# Patient Record
Sex: Female | Born: 1985
Health system: Southern US, Community
[De-identification: ages and names within clinical notes are randomized; demographics above are authoritative.]

## PROBLEM LIST (undated history)

## (undated) DIAGNOSIS — R87613 High grade squamous intraepithelial lesion on cytologic smear of cervix (HGSIL): Secondary | ICD-10-CM

## (undated) DIAGNOSIS — M797 Fibromyalgia: Secondary | ICD-10-CM

## (undated) DIAGNOSIS — E282 Polycystic ovarian syndrome: Secondary | ICD-10-CM

## (undated) DIAGNOSIS — R87612 Low grade squamous intraepithelial lesion on cytologic smear of cervix (LGSIL): Secondary | ICD-10-CM

## (undated) DIAGNOSIS — D179 Benign lipomatous neoplasm, unspecified: Secondary | ICD-10-CM

## (undated) DIAGNOSIS — R87619 Unspecified abnormal cytological findings in specimens from cervix uteri: Secondary | ICD-10-CM

## (undated) DIAGNOSIS — M069 Rheumatoid arthritis, unspecified: Secondary | ICD-10-CM

## (undated) DIAGNOSIS — A5909 Other urogenital trichomoniasis: Secondary | ICD-10-CM

## (undated) DIAGNOSIS — F341 Dysthymic disorder: Secondary | ICD-10-CM

## (undated) HISTORY — PX: LIPOMA EXCISION: SHX5283

## (undated) HISTORY — DX: Fibromyalgia: M79.7

## (undated) HISTORY — DX: Dysthymic disorder: F34.1

## (undated) HISTORY — PX: CHOLECYSTECTOMY: SHX55

## (undated) HISTORY — DX: Low grade squamous intraepithelial lesion on cytologic smear of cervix (LGSIL): R87.612

## (undated) HISTORY — DX: Benign lipomatous neoplasm, unspecified: D17.9

## (undated) HISTORY — PX: COLPOSCOPY: SHX161

## (undated) HISTORY — DX: High grade squamous intraepithelial lesion on cytologic smear of cervix (HGSIL): R87.613

## (undated) HISTORY — DX: Polycystic ovarian syndrome: E28.2

## (undated) HISTORY — DX: Unspecified abnormal cytological findings in specimens from cervix uteri: R87.619

## (undated) HISTORY — DX: Rheumatoid arthritis, unspecified: M06.9

## (undated) HISTORY — DX: Other urogenital trichomoniasis: A59.09

## (undated) HISTORY — PX: PARTIAL GASTRECTOMY: SHX2172

---
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
## 2006-12-06 DIAGNOSIS — R87619 Unspecified abnormal cytological findings in specimens from cervix uteri: Secondary | ICD-10-CM

## 2006-12-06 HISTORY — DX: Unspecified abnormal cytological findings in specimens from cervix uteri: R87.619

## 2015-11-13 ENCOUNTER — Encounter: Payer: Self-pay | Admitting: Family Medicine

## 2015-11-13 ENCOUNTER — Ambulatory Visit (INDEPENDENT_AMBULATORY_CARE_PROVIDER_SITE_OTHER): Payer: PRIVATE HEALTH INSURANCE | Admitting: Family Medicine

## 2015-11-13 VITALS — BP 109/71 | HR 72 | Resp 16 | Ht 68.0 in | Wt 174.0 lb

## 2015-11-13 DIAGNOSIS — Z124 Encounter for screening for malignant neoplasm of cervix: Secondary | ICD-10-CM | POA: Diagnosis not present

## 2015-11-13 DIAGNOSIS — Z01419 Encounter for gynecological examination (general) (routine) without abnormal findings: Secondary | ICD-10-CM | POA: Diagnosis not present

## 2015-11-13 DIAGNOSIS — Z113 Encounter for screening for infections with a predominantly sexual mode of transmission: Secondary | ICD-10-CM | POA: Diagnosis not present

## 2015-11-13 DIAGNOSIS — Z30431 Encounter for routine checking of intrauterine contraceptive device: Secondary | ICD-10-CM | POA: Diagnosis not present

## 2015-11-13 NOTE — Progress Notes (Signed)
  Subjective:     Kaylee Anderson is a 29 y.o. female and is here for a comprehensive physical exam. The patient reports problems - sexually active and some discharge noted. Has Mirena for [facility]. Still having heavy cycles.  Has h/o PCOS, which improved after weight loss following gastric bypass.  Social History   Social History  . Marital Status: Single    Spouse Name: N/A  . Number of Children: N/A  . Years of Education: N/A   Occupational History  . student    Social History Main Topics  . Smoking status: Never Smoker   . Smokeless tobacco: Never Used  . Alcohol Use: 0.0 oz/week    0 Standard drinks or equivalent per week     Comment: occassional  . Drug Use: No  . Sexual Activity:    Partners: Male     Comment: condoms and Mirena   Other Topics Concern  . Not on file   Social History Narrative  . No narrative on file   There are no preventive care reminders to display for this patient.  The following portions of the patient's history were reviewed and updated as appropriate: allergies, current medications, past family history, past medical history, past social history, past surgical history and problem list.  Review of Systems Pertinent items are noted in HPI.   Objective:    BP 109/71 mmHg  Pulse 72  Resp 16  Ht 5\' 8"  (1.727 m)  Wt 174 lb (78.926 kg)  BMI 26.46 kg/m2  LMP 10/22/2015 General appearance: alert, cooperative and appears stated age Head: Normocephalic, without obvious abnormality, atraumatic Neck: no adenopathy, supple, symmetrical, trachea midline and thyroid not enlarged, symmetric, no tenderness/mass/nodules Lungs: clear to auscultation bilaterally Breasts: normal appearance, no masses or tenderness Heart: regular rate and rhythm, S1, S2 normal, no murmur, click, rub or gallop Abdomen: soft, non-tender; bowel sounds normal; no masses,  no organomegaly Pelvic: cervix normal in appearance, external genitalia normal, no adnexal masses or tenderness, no  cervical motion tenderness, uterus normal size, shape, and consistency and green discharge noted. Extremities: extremities normal, atraumatic, no cyanosis or edema Pulses: 2+ and symmetric Skin: Skin color, texture, turgor normal. No rashes or lesions Lymph nodes: Cervical, supraclavicular, and axillary nodes normal. Neurologic: Grossly normal    Assessment:    Healthy female exam.      Plan:   Problem List Items Addressed This Visit    None    Visit Diagnoses    Encounter for routine gynecological examination    -  Primary    Relevant Orders    TSH (Completed)    CBC (Completed)    Lipid panel (Completed)    Comprehensive metabolic panel (Completed)    WET PREP BY MOLECULAR PROBE (Completed)    Genetic Screening    Screen for STD (sexually transmitted disease)        Relevant Orders    HIV antibody (Completed)    Hepatitis B surface antigen (Completed)    Hepatitis C antibody (Completed)    RPR (Completed)    Cytology - PAP    Screening for malignant neoplasm of cervix        Relevant Orders    Cytology - PAP         See After Visit Summary for Counseling Recommendations

## 2015-11-13 NOTE — Patient Instructions (Signed)
BRCA-1 and BRCA-2 Testing BRCA-1 and BRCA-2 are genes that make proteins that help repair damaged cells. BRCA-1 and BRCA-2 testing is done to see if there is a mutation in either of these genes. If there is a mutation, the genes may not be able to help repair damaged cells. As a result, the cells may develop defects that can lead to certain types of cancer. You may have this test if you have a family history of certain types of cancer, including cancer of the:  Breast.  Fallopian tubes.  Ovaries.  Peritoneum. The test requires either a sample of blood or a sample of the cells from the inside of your cheek. If a sample of blood is taken, it will be drawn from a vein in your arm using a thin needle. If a sample of cells is taken, you will get instructions on how to use a rinse to collect the sample. RESULTS It is your responsibility to obtain your test results. Ask the lab or the department doing the test when and how you will get the results. Contact your health care provider if you have any questions about your results. The lab test results can show whether:  You do not have a mutation in the BRCA-1 or BRCA-2 gene that increases your risk for certain cancers.  You have a mutation in the BRCA-1 or BRCA-2 gene that increases your risk for certain cancers.  You have a mutation in the BRCA-1 or BRCA-2 gene that has not been found to increase your risk for certain cancers. Meaning of Negative Test Results A negative test result means that you do not have a mutation in the BRCA-1 or BRCA-2 gene that is known to increase your risk for certain cancers. This does not mean you will never get cancer. Talk to your health care provider or genetic counselor about what this result means for you. Meaning of Positive Test Results A positive test result means that you do have a mutation in the BRCA-1 or BRCA-2 gene that increases your risk for certain cancers. Women with a positive test result have an  increased risk for ovarian cancer. Both women and men with a mutation have an increased risk for breast cancer and may be at greater risk for other types of cancer. Getting a positive test result does not mean you will develop cancer.  You may be told you are a carrier. This means you can pass the mutation to your children.  Talk to your health care provider or genetic counselor about what this result means for you. Meaning of Ambiguous Test Results Ambiguous, inconclusive, or uncertain test results mean there is a change in the BRCA-1 or BRCA-2 gene, but this change has not been linked to cancer. Talk to your health care provider or genetic counselor about what this result means for you.   This information is not intended to replace advice given to you by your health care provider. Make sure you discuss any questions you have with your health care provider.   Document Released: 12/16/2004 Document Revised: 12/13/2014 Document Reviewed: 02/21/2014 Elsevier Interactive Patient Education 2016 [place] for Adults, Female A healthy lifestyle and preventive care can promote health and wellness. Preventive health guidelines for women include the following key practices.  A routine yearly physical is a good way to check with your health care provider about your health and preventive screening. It is a chance to share any concerns and updates on your health and to receive  a thorough exam.  Visit your dentist for a routine exam and preventive care every 6 months. Brush your teeth twice a day and floss once a day. Good oral hygiene prevents tooth decay and gum disease.  The frequency of eye exams is based on your age, health, family medical history, use of contact lenses, and other factors. Follow your health care provider's recommendations for frequency of eye exams.  Eat a healthy diet. Foods like vegetables, fruits, whole grains, low-fat dairy products, and lean protein foods  contain the nutrients you need without too many calories. Decrease your intake of foods high in solid fats, added sugars, and salt. Eat the right amount of calories for you.Get information about a proper diet from your health care provider, if necessary.  Regular physical exercise is one of the most important things you can do for your health. Most adults should get at least 150 minutes of moderate-intensity exercise (any activity that increases your heart rate and causes you to sweat) each week. In addition, most adults need muscle-strengthening exercises on 2 or more days a week.  Maintain a healthy weight. The body mass index (BMI) is a screening tool to identify possible weight problems. It provides an estimate of body fat based on height and weight. Your health care provider can find your BMI and can help you achieve or maintain a healthy weight.For adults 20 years and older:  A BMI below 18.5 is considered underweight.  A BMI of 18.5 to 24.9 is normal.  A BMI of 25 to 29.9 is considered overweight.  A BMI of 30 and above is considered obese.  Maintain normal blood lipids and cholesterol levels by exercising and minimizing your intake of saturated fat. Eat a balanced diet with plenty of fruit and vegetables. Blood tests for lipids and cholesterol should begin at age 68 and be repeated every 5 years. If your lipid or cholesterol levels are high, you are over 50, or you are at high risk for heart disease, you may need your cholesterol levels checked more frequently.Ongoing high lipid and cholesterol levels should be treated with medicines if diet and exercise are not working.  If you smoke, find out from your health care provider how to quit. If you do not use tobacco, do not start.  Lung cancer screening is recommended for adults aged 75-80 years who are at high risk for developing lung cancer because of a history of smoking. A yearly low-dose CT scan of the lungs is recommended for people  who have at least a 30-pack-year history of smoking and are a current smoker or have quit within the past 15 years. A pack year of smoking is smoking an average of 1 pack of cigarettes a day for 1 year (for example: 1 pack a day for 30 years or 2 packs a day for 15 years). Yearly screening should continue until the smoker has stopped smoking for at least 15 years. Yearly screening should be stopped for people who develop a health problem that would prevent them from having lung cancer treatment.  If you are pregnant, do not drink alcohol. If you are breastfeeding, be very cautious about drinking alcohol. If you are not pregnant and choose to drink alcohol, do not have more than 1 drink per day. One drink is considered to be 12 ounces (355 mL) of beer, 5 ounces (148 mL) of wine, or 1.5 ounces (44 mL) of liquor.  Avoid use of street drugs. Do not share needles with anyone.  Ask for help if you need support or instructions about stopping the use of drugs.  High blood pressure causes heart disease and increases the risk of stroke. Your blood pressure should be checked at least every 1 to 2 years. Ongoing high blood pressure should be treated with medicines if weight loss and exercise do not work.  If you are 46-80 years old, ask your health care provider if you should take aspirin to prevent strokes.  Diabetes screening is done by taking a blood sample to check your blood glucose level after you have not eaten for a certain period of time (fasting). If you are not overweight and you do not have risk factors for diabetes, you should be screened once every 3 years starting at age 20. If you are overweight or obese and you are 73-45 years of age, you should be screened for diabetes every year as part of your cardiovascular risk assessment.  Breast cancer screening is essential preventive care for women. You should practice "breast self-awareness." This means understanding the normal appearance and feel of your  breasts and may include breast self-examination. Any changes detected, no matter how small, should be reported to a health care provider. Women in their 46s and 30s should have a clinical breast exam (CBE) by a health care provider as part of a regular health exam every 1 to 3 years. After age 64, women should have a CBE every year. Starting at age 37, women should consider having a mammogram (breast X-ray test) every year. Women who have a family history of breast cancer should talk to their health care provider about genetic screening. Women at a high risk of breast cancer should talk to their health care providers about having an MRI and a mammogram every year.  Breast cancer gene (BRCA)-related cancer risk assessment is recommended for women who have family members with BRCA-related cancers. BRCA-related cancers include breast, ovarian, tubal, and peritoneal cancers. Having family members with these cancers may be associated with an increased risk for harmful changes (mutations) in the breast cancer genes BRCA1 and BRCA2. Results of the assessment will determine the need for genetic counseling and BRCA1 and BRCA2 testing.  Your health care provider may recommend that you be screened regularly for cancer of the pelvic organs (ovaries, uterus, and vagina). This screening involves a pelvic examination, including checking for microscopic changes to the surface of your cervix (Pap test). You may be encouraged to have this screening done every 3 years, beginning at age 60.  For women ages 20-65, health care providers may recommend pelvic exams and Pap testing every 3 years, or they may recommend the Pap and pelvic exam, combined with testing for human papilloma virus (HPV), every 5 years. Some types of HPV increase your risk of cervical cancer. Testing for HPV may also be done on women of any age with unclear Pap test results.  Other health care providers may not recommend any screening for nonpregnant women  who are considered low risk for pelvic cancer and who do not have symptoms. Ask your health care provider if a screening pelvic exam is right for you.  If you have had past treatment for cervical cancer or a condition that could lead to cancer, you need Pap tests and screening for cancer for at least 20 years after your treatment. If Pap tests have been discontinued, your risk factors (such as having a new sexual partner) need to be reassessed to determine if screening should resume. Some women  have medical problems that increase the chance of getting cervical cancer. In these cases, your health care provider may recommend more frequent screening and Pap tests.  Colorectal cancer can be detected and often prevented. Most routine colorectal cancer screening begins at the age of 88 years and continues through age 17 years. However, your health care provider may recommend screening at an earlier age if you have risk factors for colon cancer. On a yearly basis, your health care provider may provide home test kits to check for hidden blood in the stool. Use of a small camera at the end of a tube, to directly examine the colon (sigmoidoscopy or colonoscopy), can detect the earliest forms of colorectal cancer. Talk to your health care provider about this at age 90, when routine screening begins. Direct exam of the colon should be repeated every 5-10 years through age 84 years, unless early forms of precancerous polyps or small growths are found.  People who are at an increased risk for hepatitis B should be screened for this virus. You are considered at high risk for hepatitis B if:  You were born in a country where hepatitis B occurs often. Talk with your health care provider about which countries are considered high risk.  Your parents were born in a high-risk country and you have not received a shot to protect against hepatitis B (hepatitis B vaccine).  You have HIV or AIDS.  You use needles to inject  street drugs.  You live with, or have sex with, someone who has hepatitis B.  You get hemodialysis treatment.  You take certain medicines for conditions like cancer, organ transplantation, and autoimmune conditions.  Hepatitis C blood testing is recommended for all people born from [birth year] through 1964 and any individual with known risks for hepatitis C.  Practice safe sex. Use condoms and avoid high-risk sexual practices to reduce the spread of sexually transmitted infections (STIs). STIs include gonorrhea, chlamydia, syphilis, trichomonas, herpes, HPV, and human immunodeficiency virus (HIV). Herpes, HIV, and HPV are viral illnesses that have no cure. They can result in disability, cancer, and death.  You should be screened for sexually transmitted illnesses (STIs) including gonorrhea and chlamydia if:  You are sexually active and are younger than 24 years.  You are older than 24 years and your health care provider tells you that you are at risk for this type of infection.  Your sexual activity has changed since you were last screened and you are at an increased risk for chlamydia or gonorrhea. Ask your health care provider if you are at risk.  If you are at risk of being infected with HIV, it is recommended that you take a prescription medicine daily to prevent HIV infection. This is called preexposure prophylaxis (PrEP). You are considered at risk if:  You are sexually active and do not regularly use condoms or know the HIV status of your partner(s).  You take drugs by injection.  You are sexually active with a partner who has HIV.  Talk with your health care provider about whether you are at high risk of being infected with HIV. If you choose to begin PrEP, you should first be tested for HIV. You should then be tested every 3 months for as long as you are taking PrEP.  Osteoporosis is a disease in which the bones lose minerals and strength with aging. This can result in serious bone  fractures or breaks. The risk of osteoporosis can be identified using a bone density  scan. Women ages 59 years and over and women at risk for fractures or osteoporosis should discuss screening with their health care providers. Ask your health care provider whether you should take a calcium supplement or vitamin D to reduce the rate of osteoporosis.  Menopause can be associated with physical symptoms and risks. Hormone replacement therapy is available to decrease symptoms and risks. You should talk to your health care provider about whether hormone replacement therapy is right for you.  Use sunscreen. Apply sunscreen liberally and repeatedly throughout the day. You should seek shade when your shadow is shorter than you. Protect yourself by wearing long sleeves, pants, a wide-brimmed hat, and sunglasses year round, whenever you are outdoors.  Once a month, do a whole body skin exam, using a mirror to look at the skin on your back. Tell your health care provider of new moles, moles that have irregular borders, moles that are larger than a pencil eraser, or moles that have changed in shape or color.  Stay current with required vaccines (immunizations).  Influenza vaccine. All adults should be immunized every year.  Tetanus, diphtheria, and acellular pertussis (Td, Tdap) vaccine. Pregnant women should receive 1 dose of Tdap vaccine during each pregnancy. The dose should be obtained regardless of the length of time since the last dose. Immunization is preferred during the 27th-36th week of gestation. An adult who has not previously received Tdap or who does not know her vaccine status should receive 1 dose of Tdap. This initial dose should be followed by tetanus and diphtheria toxoids (Td) booster doses every 10 years. Adults with an unknown or incomplete history of completing a 3-dose immunization series with Td-containing vaccines should begin or complete a primary immunization series including a Tdap dose.  Adults should receive a Td booster every 10 years.  Varicella vaccine. An adult without evidence of immunity to varicella should receive 2 doses or a second dose if she has previously received 1 dose. Pregnant females who do not have evidence of immunity should receive the first dose after pregnancy. This first dose should be obtained before leaving the health care facility. The second dose should be obtained 4-8 weeks after the first dose.  Human papillomavirus (HPV) vaccine. Females aged 13-26 years who have not received the vaccine previously should obtain the 3-dose series. The vaccine is not recommended for use in pregnant females. However, pregnancy testing is not needed before receiving a dose. If a female is found to be pregnant after receiving a dose, no treatment is needed. In that case, the remaining doses should be delayed until after the pregnancy. Immunization is recommended for any person with an immunocompromised condition through the age of 37 years if she did not get any or all doses earlier. During the 3-dose series, the second dose should be obtained 4-8 weeks after the first dose. The third dose should be obtained 24 weeks after the first dose and 16 weeks after the second dose.  Zoster vaccine. One dose is recommended for adults aged 53 years or older unless certain conditions are present.  Measles, mumps, and rubella (MMR) vaccine. Adults born before [birth year] generally are considered immune to measles and mumps. Adults born in [birth year] or later should have 1 or more doses of MMR vaccine unless there is a contraindication to the vaccine or there is laboratory evidence of immunity to each of the three diseases. A routine second dose of MMR vaccine should be obtained at least 28 days after the first dose  for students attending postsecondary schools, health care workers, or international travelers. People who received inactivated measles vaccine or an unknown type of measles vaccine during  1963-1967 should receive 2 doses of MMR vaccine. People who received inactivated mumps vaccine or an unknown type of mumps vaccine before 1979 and are at high risk for mumps infection should consider immunization with 2 doses of MMR vaccine. For females of childbearing age, rubella immunity should be determined. If there is no evidence of immunity, females who are not pregnant should be vaccinated. If there is no evidence of immunity, females who are pregnant should delay immunization until after pregnancy. Unvaccinated health care workers born before [birth year] who lack laboratory evidence of measles, mumps, or rubella immunity or laboratory confirmation of disease should consider measles and mumps immunization with 2 doses of MMR vaccine or rubella immunization with 1 dose of MMR vaccine.  Pneumococcal 13-valent conjugate (PCV13) vaccine. When indicated, a person who is uncertain of his immunization history and has no record of immunization should receive the PCV13 vaccine. All adults 37 years of age and older should receive this vaccine. An adult aged 83 years or older who has certain medical conditions and has not been previously immunized should receive 1 dose of PCV13 vaccine. This PCV13 should be followed with a dose of pneumococcal polysaccharide (PPSV23) vaccine. Adults who are at high risk for pneumococcal disease should obtain the PPSV23 vaccine at least 8 weeks after the dose of PCV13 vaccine. Adults older than 28 years of age who have normal immune system function should obtain the PPSV23 vaccine dose at least 1 year after the dose of PCV13 vaccine.  Pneumococcal polysaccharide (PPSV23) vaccine. When PCV13 is also indicated, PCV13 should be obtained first. All adults aged 44 years and older should be immunized. An adult younger than age 56 years who has certain medical conditions should be immunized. Any person who resides in a nursing home or long-term care facility should be immunized. An adult smoker  should be immunized. People with an immunocompromised condition and certain other conditions should receive both PCV13 and PPSV23 vaccines. People with human immunodeficiency virus (HIV) infection should be immunized as soon as possible after diagnosis. Immunization during chemotherapy or radiation therapy should be avoided. Routine use of PPSV23 vaccine is not recommended for American Indians, [place] Natives, or people younger than 65 years unless there are medical conditions that require PPSV23 vaccine. When indicated, people who have unknown immunization and have no record of immunization should receive PPSV23 vaccine. One-time revaccination 5 years after the first dose of PPSV23 is recommended for people aged 19-64 years who have chronic kidney failure, nephrotic syndrome, asplenia, or immunocompromised conditions. People who received 1-2 doses of PPSV23 before age 72 years should receive another dose of PPSV23 vaccine at age 38 years or later if at least 5 years have passed since the previous dose. Doses of PPSV23 are not needed for people immunized with PPSV23 at or after age 38 years.  Meningococcal vaccine. Adults with asplenia or persistent complement component deficiencies should receive 2 doses of quadrivalent meningococcal conjugate (MenACWY-D) vaccine. The doses should be obtained at least 2 months apart. Microbiologists working with certain meningococcal bacteria, [employer] recruits, people at risk during an outbreak, and people who travel to or live in countries with a high rate of meningitis should be immunized. A first-year college student up through age 31 years who is living in a residence hall should receive a dose if she did not receive a dose  on or after her 16th birthday. Adults who have certain high-risk conditions should receive one or more doses of vaccine.  Hepatitis A vaccine. Adults who wish to be protected from this disease, have certain high-risk conditions, work with hepatitis  A-infected animals, work in hepatitis A research labs, or travel to or work in countries with a high rate of hepatitis A should be immunized. Adults who were previously unvaccinated and who anticipate close contact with an international adoptee during the first 60 days after arrival in the [place] from a country with a high rate of hepatitis A should be immunized.  Hepatitis B vaccine. Adults who wish to be protected from this disease, have certain high-risk conditions, may be exposed to blood or other infectious body fluids, are household contacts or sex partners of hepatitis B positive people, are clients or workers in certain care facilities, or travel to or work in countries with a high rate of hepatitis B should be immunized.  Haemophilus influenzae type b (Hib) vaccine. A previously unvaccinated person with asplenia or sickle cell disease or having a scheduled splenectomy should receive 1 dose of Hib vaccine. Regardless of previous immunization, a recipient of a hematopoietic stem cell transplant should receive a 3-dose series 6-12 months after her successful transplant. Hib vaccine is not recommended for adults with HIV infection. Preventive Services / Frequency Ages 72 to 60 years  Blood pressure check.** / Every 3-5 years.  Lipid and cholesterol check.** / Every 5 years beginning at age 5.  Clinical breast exam.** / Every 3 years for women in their 24s and 74s.  BRCA-related cancer risk assessment.** / For women who have family members with a BRCA-related cancer (breast, ovarian, tubal, or peritoneal cancers).  Pap test.** / Every 2 years from ages 96 through 35. Every 3 years starting at age 71 through age 28 or 6 with a history of 3 consecutive normal Pap tests.  HPV screening.** / Every 3 years from ages 70 through ages 51 to 51 with a history of 3 consecutive normal Pap tests.  Hepatitis C blood test.** / For any individual with known risks for hepatitis C.  Skin self-exam.  / Monthly.  Influenza vaccine. / Every year.  Tetanus, diphtheria, and acellular pertussis (Tdap, Td) vaccine.** / Consult your health care provider. Pregnant women should receive 1 dose of Tdap vaccine during each pregnancy. 1 dose of Td every 10 years.  Varicella vaccine.** / Consult your health care provider. Pregnant females who do not have evidence of immunity should receive the first dose after pregnancy.  HPV vaccine. / 3 doses over 6 months, if 39 and younger. The vaccine is not recommended for use in pregnant females. However, pregnancy testing is not needed before receiving a dose.  Measles, mumps, rubella (MMR) vaccine.** / You need at least 1 dose of MMR if you were born in 1957 or later. You may also need a 2nd dose. For females of childbearing age, rubella immunity should be determined. If there is no evidence of immunity, females who are not pregnant should be vaccinated. If there is no evidence of immunity, females who are pregnant should delay immunization until after pregnancy.  Pneumococcal 13-valent conjugate (PCV13) vaccine.** / Consult your health care provider.  Pneumococcal polysaccharide (PPSV23) vaccine.** / 1 to 2 doses if you smoke cigarettes or if you have certain conditions.  Meningococcal vaccine.** / 1 dose if you are age 3 to 54 years and a Market researcher living in a residence hall,  or have one of several medical conditions, you need to get vaccinated against meningococcal disease. You may also need additional booster doses.  Hepatitis A vaccine.** / Consult your health care provider.  Hepatitis B vaccine.** / Consult your health care provider.  Haemophilus influenzae type b (Hib) vaccine.** / Consult your health care provider. Ages 36 to 84 years  Blood pressure check.** / Every year.  Lipid and cholesterol check.** / Every 5 years beginning at age 29 years.  Lung cancer screening. / Every year if you are aged 52-80 years and have a  30-pack-year history of smoking and currently smoke or have quit within the past 15 years. Yearly screening is stopped once you have quit smoking for at least 15 years or develop a health problem that would prevent you from having lung cancer treatment.  Clinical breast exam.** / Every year after age 52 years.  BRCA-related cancer risk assessment.** / For women who have family members with a BRCA-related cancer (breast, ovarian, tubal, or peritoneal cancers).  Mammogram.** / Every year beginning at age 39 years and continuing for as long as you are in good health. Consult with your health care provider.  Pap test.** / Every 3 years starting at age 88 years through age 31 or 75 years with a history of 3 consecutive normal Pap tests.  HPV screening.** / Every 3 years from ages 9 years through ages 24 to 48 years with a history of 3 consecutive normal Pap tests.  Fecal occult blood test (FOBT) of stool. / Every year beginning at age 41 years and continuing until age 21 years. You may not need to do this test if you get a colonoscopy every 10 years.  Flexible sigmoidoscopy or colonoscopy.** / Every 5 years for a flexible sigmoidoscopy or every 10 years for a colonoscopy beginning at age 68 years and continuing until age 12 years.  Hepatitis C blood test.** / For all people born from [birth year] through 1965 and any individual with known risks for hepatitis C.  Skin self-exam. / Monthly.  Influenza vaccine. / Every year.  Tetanus, diphtheria, and acellular pertussis (Tdap/Td) vaccine.** / Consult your health care provider. Pregnant women should receive 1 dose of Tdap vaccine during each pregnancy. 1 dose of Td every 10 years.  Varicella vaccine.** / Consult your health care provider. Pregnant females who do not have evidence of immunity should receive the first dose after pregnancy.  Zoster vaccine.** / 1 dose for adults aged 66 years or older.  Measles, mumps, rubella (MMR) vaccine.** / You need  at least 1 dose of MMR if you were born in 1957 or later. You may also need a second dose. For females of childbearing age, rubella immunity should be determined. If there is no evidence of immunity, females who are not pregnant should be vaccinated. If there is no evidence of immunity, females who are pregnant should delay immunization until after pregnancy.  Pneumococcal 13-valent conjugate (PCV13) vaccine.** / Consult your health care provider.  Pneumococcal polysaccharide (PPSV23) vaccine.** / 1 to 2 doses if you smoke cigarettes or if you have certain conditions.  Meningococcal vaccine.** / Consult your health care provider.  Hepatitis A vaccine.** / Consult your health care provider.  Hepatitis B vaccine.** / Consult your health care provider.  Haemophilus influenzae type b (Hib) vaccine.** / Consult your health care provider. Ages 52 years and over  Blood pressure check.** / Every year.  Lipid and cholesterol check.** / Every 5 years beginning at age 35  years.  Lung cancer screening. / Every year if you are aged 27-80 years and have a 30-pack-year history of smoking and currently smoke or have quit within the past 15 years. Yearly screening is stopped once you have quit smoking for at least 15 years or develop a health problem that would prevent you from having lung cancer treatment.  Clinical breast exam.** / Every year after age 47 years.  BRCA-related cancer risk assessment.** / For women who have family members with a BRCA-related cancer (breast, ovarian, tubal, or peritoneal cancers).  Mammogram.** / Every year beginning at age 62 years and continuing for as long as you are in good health. Consult with your health care provider.  Pap test.** / Every 3 years starting at age 49 years through age 59 or 77 years with 3 consecutive normal Pap tests. Testing can be stopped between 65 and 70 years with 3 consecutive normal Pap tests and no abnormal Pap or HPV tests in the past 10  years.  HPV screening.** / Every 3 years from ages 59 years through ages 80 or 76 years with a history of 3 consecutive normal Pap tests. Testing can be stopped between 65 and 70 years with 3 consecutive normal Pap tests and no abnormal Pap or HPV tests in the past 10 years.  Fecal occult blood test (FOBT) of stool. / Every year beginning at age 26 years and continuing until age 51 years. You may not need to do this test if you get a colonoscopy every 10 years.  Flexible sigmoidoscopy or colonoscopy.** / Every 5 years for a flexible sigmoidoscopy or every 10 years for a colonoscopy beginning at age 31 years and continuing until age 52 years.  Hepatitis C blood test.** / For all people born from [birth year] through 1965 and any individual with known risks for hepatitis C.  Osteoporosis screening.** / A one-time screening for women ages 68 years and over and women at risk for fractures or osteoporosis.  Skin self-exam. / Monthly.  Influenza vaccine. / Every year.  Tetanus, diphtheria, and acellular pertussis (Tdap/Td) vaccine.** / 1 dose of Td every 10 years.  Varicella vaccine.** / Consult your health care provider.  Zoster vaccine.** / 1 dose for adults aged 47 years or older.  Pneumococcal 13-valent conjugate (PCV13) vaccine.** / Consult your health care provider.  Pneumococcal polysaccharide (PPSV23) vaccine.** / 1 dose for all adults aged 45 years and older.  Meningococcal vaccine.** / Consult your health care provider.  Hepatitis A vaccine.** / Consult your health care provider.  Hepatitis B vaccine.** / Consult your health care provider.  Haemophilus influenzae type b (Hib) vaccine.** / Consult your health care provider. ** Family history and personal history of risk and conditions may change your health care provider's recommendations.   This information is not intended to replace advice given to you by your health care provider. Make sure you discuss any questions you have with  your health care provider.   Document Released: 01/18/2002 Document Revised: 12/13/2014 Document Reviewed: 04/19/2011 Elsevier Interactive Patient Education [employer].

## 2015-11-14 ENCOUNTER — Telehealth: Payer: Self-pay | Admitting: *Deleted

## 2015-11-14 DIAGNOSIS — A599 Trichomoniasis, unspecified: Secondary | ICD-10-CM

## 2015-11-14 LAB — COMPREHENSIVE METABOLIC PANEL
ALT: 6 U/L (ref 6–29)
AST: 13 U/L (ref 10–30)
Albumin: 4 g/dL (ref 3.6–5.1)
Alkaline Phosphatase: 89 U/L (ref 33–115)
BUN: 11 mg/dL (ref 7–25)
CHLORIDE: 103 mmol/L (ref 98–110)
CO2: 27 mmol/L (ref 20–31)
CREATININE: 0.85 mg/dL (ref 0.50–1.10)
Calcium: 8.7 mg/dL (ref 8.6–10.2)
Glucose, Bld: 74 mg/dL (ref 65–99)
Potassium: 3.7 mmol/L (ref 3.5–5.3)
SODIUM: 138 mmol/L (ref 135–146)
Total Bilirubin: 0.6 mg/dL (ref 0.2–1.2)
Total Protein: 6.8 g/dL (ref 6.1–8.1)

## 2015-11-14 LAB — RPR

## 2015-11-14 LAB — CBC
HCT: 35.9 % — ABNORMAL LOW (ref 36.0–46.0)
Hemoglobin: 11.4 g/dL — ABNORMAL LOW (ref 12.0–15.0)
MCH: 30.1 pg (ref 26.0–34.0)
MCHC: 31.8 g/dL (ref 30.0–36.0)
MCV: 94.7 fL (ref 78.0–100.0)
MPV: 9.8 fL (ref 8.6–12.4)
Platelets: 333 K/uL (ref 150–400)
RBC: 3.79 MIL/uL — ABNORMAL LOW (ref 3.87–5.11)
RDW: 13 % (ref 11.5–15.5)
WBC: 4.3 K/uL (ref 4.0–10.5)

## 2015-11-14 LAB — LIPID PANEL
CHOL/HDL RATIO: 1.5 ratio (ref <=5.0)
Cholesterol: 111 mg/dL — ABNORMAL LOW (ref 125–200)
HDL: 75 mg/dL (ref >=46)
LDL CALC: 27 mg/dL (ref <130)
Triglycerides: 46 mg/dL (ref <150)
VLDL: 9 mg/dL (ref <30)

## 2015-11-14 LAB — WET PREP BY MOLECULAR PROBE
Candida species: NEGATIVE
Gardnerella vaginalis: NEGATIVE
Trichomonas vaginosis: POSITIVE — AB

## 2015-11-14 LAB — HEPATITIS B SURFACE ANTIGEN: HEP B S AG: NEGATIVE

## 2015-11-14 LAB — HEPATITIS C ANTIBODY: HCV Ab: NEGATIVE

## 2015-11-14 LAB — TSH: TSH: 0.956 u[IU]/mL (ref 0.350–4.500)

## 2015-11-14 LAB — HIV ANTIBODY (ROUTINE TESTING W REFLEX): HIV 1&2 Ab, 4th Generation: NONREACTIVE

## 2015-11-14 MED ORDER — METRONIDAZOLE 500 MG PO TABS: ORAL_TABLET | ORAL | Status: DC

## 2015-11-14 NOTE — Telephone Encounter (Signed)
Pt notified of positive Trichomonas and RX was sent to [facility]. Pt made aware that her partner/partners will need to be treated also.  [facility] notified.

## 2015-11-18 LAB — CYTOLOGY - PAP

## 2015-12-04 ENCOUNTER — Encounter: Payer: Self-pay | Admitting: *Deleted

## 2015-12-04 ENCOUNTER — Telehealth: Payer: Self-pay | Admitting: *Deleted

## 2015-12-04 NOTE — Telephone Encounter (Signed)
Copy of neg My Risk mailed to pt's home address.  Number for genetic counselor provided if pt has questions.

## 2015-12-08 ENCOUNTER — Encounter: Payer: Self-pay | Admitting: Family Medicine

## 2015-12-08 DIAGNOSIS — Z809 Family history of malignant neoplasm, unspecified: Secondary | ICD-10-CM | POA: Insufficient documentation

## 2016-07-04 ENCOUNTER — Emergency Department
Admission: EM | Admit: 2016-07-04 | Discharge: 2016-07-04 | Disposition: A | Discharge status: Home / Self Care | Payer: 59 | Source: Home / Self Care | Attending: Family Medicine | Admitting: Family Medicine

## 2016-07-04 ENCOUNTER — Encounter: Payer: Self-pay | Admitting: Emergency Medicine

## 2016-07-04 DIAGNOSIS — R8290 Unspecified abnormal findings in urine: Secondary | ICD-10-CM

## 2016-07-04 DIAGNOSIS — R8299 Other abnormal findings in urine: Secondary | ICD-10-CM

## 2016-07-04 DIAGNOSIS — M545 Low back pain, unspecified: Secondary | ICD-10-CM

## 2016-07-04 DIAGNOSIS — Z87440 Personal history of urinary (tract) infections: Secondary | ICD-10-CM

## 2016-07-04 DIAGNOSIS — N390 Urinary tract infection, site not specified: Secondary | ICD-10-CM | POA: Diagnosis not present

## 2016-07-04 LAB — POCT URINALYSIS DIP (MANUAL ENTRY)
Bilirubin, UA: NEGATIVE
Blood, UA: NEGATIVE
Glucose, UA: NEGATIVE
Ketones, POC UA: NEGATIVE
Nitrite, UA: NEGATIVE
Protein Ur, POC: NEGATIVE
Spec Grav, UA: 1.02
Urobilinogen, UA: 2
pH, UA: 6

## 2016-07-04 MED ORDER — CEPHALEXIN 500 MG PO CAPS: 500.0000 mg | ORAL_CAPSULE | Freq: Two times a day (BID) | ORAL | 0 refills | Status: DC

## 2016-07-04 NOTE — ED Triage Notes (Signed)
Pt c/o possible UTI, low back pain, odor to urine, cloudy urine, no dysuria.

## 2016-07-04 NOTE — ED Provider Notes (Signed)
CSN: [id number]     Arrival date & time 07/04/16  1227 History   First MD Initiated Contact with Patient 07/04/16 1256     Chief Complaint  Patient presents with  . Urinary Tract Infection   (Consider location/radiation/quality/duration/timing/severity/associated sxs/prior Treatment) HPI  Kaylee Anderson is a 30 y.o. female presenting to UC with c/o 3-4 days of urinary symptoms including malodorous urine, lower back pain, and cloudy urine. Symptoms are mild in severity.  Hx of UTIs, last one about 1 year ago. Symptoms similar to prior UTIs. Denies dysuria or hematuria. Denies vaginal symptoms, fever, chills, n/v/d. She has not tried anything for her symptoms. LMP: 06/07/16   Past Medical History:  Diagnosis Date  . Abnormal Pap smear of cervix 2008   cryo  . Fibromyalgia   . PCOS (polycystic ovarian syndrome)    Past Surgical History:  Procedure Laterality Date  . CHOLECYSTECTOMY    . gatrectomy  2011  . KNEE ARTHROSCOPY Left 2002   Family History  Problem Relation Age of Onset  . Hypertension Mother   . Diabetes Mother   . Cancer Mother 21    breast  . Sarcoidosis Father   . Non-Hodgkin's lymphoma Father   . Hypertension Father   . Cancer Paternal Aunt 78    breast   . Cancer Paternal Aunt 74    ovarian  . Cancer Maternal Aunt 48    breast  . Cancer Maternal Aunt 50    breast and ovarian  . Cancer Maternal Aunt 49    breast  . Cancer Maternal Aunt 48    breast   Social History  Substance Use Topics  . Smoking status: Never Smoker  . Smokeless tobacco: Never Used  . Alcohol use 0.0 oz/week     Comment: occassional   OB History    Gravida Para Term Preterm AB Living   1 1 1     1    SAB TAB Ectopic Multiple Live Births                 Review of Systems  Constitutional: Negative for chills and fever.  Gastrointestinal: Negative for abdominal pain, diarrhea, nausea and vomiting.  Genitourinary: Positive for flank pain ( bilateral) and urgency. Negative for  decreased urine volume, dysuria, frequency, hematuria, menstrual problem, vaginal bleeding, vaginal discharge and vaginal pain.  Musculoskeletal: Positive for back pain. Negative for gait problem, joint swelling and myalgias.  Skin: Negative for color change and rash.    Allergies  Levaquin [levofloxacin in d5w]; Shellfish allergy; Sulfa antibiotics; and Iodine  Home Medications   Prior to Admission medications   Medication Sig Start Date End Date Taking? Authorizing Provider  cephALEXin (KEFLEX) 500 MG capsule Take 1 capsule (500 mg total) by mouth 2 (two) times daily. For 7 days 07/04/16   Noland Fordyce, PA-C   Meds Ordered and Administered this Visit  Medications - No data to display  BP 125/77 (BP Location: Left Arm)   Pulse 70   Temp 98.5 F (36.9 C) (Oral)   Ht 5\' 8"  (1.727 m)   Wt 170 lb (77.1 kg)   LMP 06/07/2016 (Exact Date)   SpO2 100%   BMI 25.85 kg/m  No data found.   Physical Exam  Constitutional: She is oriented to person, place, and time. She appears well-developed and well-nourished.  HENT:  Head: Normocephalic and atraumatic.  Mouth/Throat: Oropharynx is clear and moist.  Eyes: EOM are normal.  Neck: Normal range of motion.  Cardiovascular:  Normal rate and regular rhythm.   Pulmonary/Chest: Effort normal. No respiratory distress. She has no wheezes. She has no rales.  Abdominal: Soft. She exhibits no distension. There is no tenderness. There is CVA tenderness ( bilateral).  Musculoskeletal: Normal range of motion.  Neurological: She is alert and oriented to person, place, and time.  Skin: Skin is warm and dry.  Psychiatric: She has a normal mood and affect. Her behavior is normal.  Nursing note and vitals reviewed.   Urgent Care Course   Clinical Course    Procedures (including critical care time)  Labs Review Labs Reviewed  POCT URINALYSIS DIP (MANUAL ENTRY) - Abnormal; Notable for the following:       Result Value   Leukocytes, UA Trace (*)     All other components within normal limits  URINE CULTURE    Imaging Review No results found.   MDM   1. Bilateral low back pain without sciatica   2. Malodorous urine   3. Cloudy urine   4. History of recurrent UTIs    Pt c/o urinary symptoms c/w prior UTIs.    UA: trace leukocytes, will send culture. Will start pt on antibiotics as she is symptomatic, similar to prior UTIs. Encouraged to stay well hydrated. Rx: Keflex     Noland Fordyce, PA-C 07/04/16 1308

## 2016-07-04 NOTE — Discharge Instructions (Signed)
Be sure to stay well hydrated.  You will be notified of urine culture results in 2-3 days.  If you do not hear back from our office and still having symptoms, please call to check on your labs.

## 2016-07-06 LAB — URINE CULTURE

## 2016-07-08 ENCOUNTER — Telehealth: Payer: Self-pay | Admitting: *Deleted

## 2016-07-08 NOTE — Telephone Encounter (Signed)
Callback: LMOM f/u from visit, call back if not seeing much or any improvement as we will need to change antibiotic.

## 2017-06-20 ENCOUNTER — Ambulatory Visit: Payer: 59 | Admitting: Physician Assistant

## 2017-06-21 ENCOUNTER — Encounter: Payer: Self-pay | Admitting: Physician Assistant

## 2017-06-21 ENCOUNTER — Ambulatory Visit (INDEPENDENT_AMBULATORY_CARE_PROVIDER_SITE_OTHER): Payer: 59 | Admitting: Physician Assistant

## 2017-06-21 VITALS — BP 113/70 | HR 75 | Wt 184.0 lb

## 2017-06-21 DIAGNOSIS — Z7689 Persons encountering health services in other specified circumstances: Secondary | ICD-10-CM | POA: Diagnosis not present

## 2017-06-21 DIAGNOSIS — M069 Rheumatoid arthritis, unspecified: Secondary | ICD-10-CM | POA: Insufficient documentation

## 2017-06-21 DIAGNOSIS — Z8481 Family history of carrier of genetic disease: Secondary | ICD-10-CM | POA: Diagnosis not present

## 2017-06-21 DIAGNOSIS — F341 Dysthymic disorder: Secondary | ICD-10-CM | POA: Insufficient documentation

## 2017-06-21 DIAGNOSIS — M797 Fibromyalgia: Secondary | ICD-10-CM

## 2017-06-21 DIAGNOSIS — Z975 Presence of (intrauterine) contraceptive device: Secondary | ICD-10-CM | POA: Insufficient documentation

## 2017-06-21 HISTORY — DX: Rheumatoid arthritis, unspecified: M06.9

## 2017-06-21 HISTORY — DX: Dysthymic disorder: F34.1

## 2017-06-21 MED ORDER — DULOXETINE HCL 30 MG PO CPEP: 30.0000 mg | ORAL_CAPSULE | Freq: Every day | ORAL | 1 refills | Status: DC

## 2017-06-21 MED ORDER — CYCLOBENZAPRINE HCL 5 MG PO TABS: 5.0000 mg | ORAL_TABLET | Freq: Three times a day (TID) | ORAL | 1 refills | Status: DC | PRN

## 2017-06-21 MED ORDER — LEVOMEFOLATE GLUCOSAMINE 400 MCG PO CAPS: 400.0000 ug | ORAL_CAPSULE | Freq: Every day | ORAL | 11 refills | Status: DC

## 2017-06-21 NOTE — Progress Notes (Signed)
HPI:                                                                Kaylee Anderson is a 31 y.o. female who presents to [place]: [place] today to establish care  Current Concerns include fibromyalgia    Fibromyalgia. Patient reports she had a negative work-up, including nerve conduction studies, with neurology and was diagnosed with fibromyalgia ten years ago. Reports soreness all of hte time. More mental fog and fatigue. States she was previously taking Cymbalta, Methylfolate and muscle relaxer prn and this regimen worked well for her. Never established with a PCP when she relocated to [facility], so has been off of Cymbalta for about 1 year.   Depression: not currently on medication. Endorses increased irritability, labile mood, and anhedonia. Avoiding social situations. States family members have noticed. Denies symptoms of mania/hypomania. Denies suicidal thinking. Denies auditory/visual hallucinations. Reports has been on TCA in the past, but discontinued due to cognitive side effects.   Health Maintenance Health Maintenance  Topic Date Due  . TETANUS/TDAP  06/10/2006  . INFLUENZA VACCINE  07/06/2017  . PAP SMEAR  11/12/2018  . HIV Screening  Completed    GYN/Sexual Health  Menstrual status: IUD  LMP: end of april  Menses: irregular  Last pap smear: 11/13/2015, NILM, trichomonas   History of abnormal pap smears: yes, HGSIL and LGSIL in her 20's  Sexually active: yes  Current contraception: IUD   Past Medical History:  Diagnosis Date  . Abnormal Pap smear of cervix 2008   cryo  . Fibromyalgia   . HGSIL on cytologic smear of cervix    s/p colposcopy, most recent pap normal  . LGSIL on Pap smear of cervix   . Multiple lipomas   . PCOS (polycystic ovarian syndrome)   . Trichomonal cervicitis    Past Surgical History:  Procedure Laterality Date  . CHOLECYSTECTOMY    . COLPOSCOPY    . KNEE ARTHROSCOPY Left 2002  . LIPOMA  EXCISION    . PARTIAL GASTRECTOMY     Social History  Substance Use Topics  . Smoking status: Never Smoker  . Smokeless tobacco: Never Used  . Alcohol use 0.0 oz/week     Comment: occassional   family history includes Cancer (age of onset: 69) in her paternal aunt; Cancer (age of onset: 60) in her paternal aunt; Cancer (age of onset: 69) in her maternal aunt, maternal aunt, and mother; Cancer (age of onset: 81) in her maternal aunt; Cancer (age of onset: 66) in her maternal aunt; Diabetes in her mother; Hypertension in her father and mother; Non-Hodgkin's lymphoma in her father; Sarcoidosis in her father.  ROS: Review of Systems  Constitutional: Negative.   HENT: Negative.   Eyes: Positive for blurred vision (wears corrective lenses).  Respiratory: Negative.   Cardiovascular: Negative.   Gastrointestinal: Negative.   Musculoskeletal: Positive for myalgias.  Skin: Negative.   Neurological: Negative.   Endo/Heme/Allergies: Bruises/bleeds easily.  Psychiatric/Behavioral: Positive for depression and memory loss. The patient is nervous/anxious and has insomnia.     Medications: Current Outpatient Prescriptions  Medication Sig Dispense Refill  . levonorgestrel (MIRENA) 20 MCG/24HR IUD 1 each by Intrauterine route once.    . cyclobenzaprine (FLEXERIL) 5 MG  tablet Take 1 tablet (5 mg total) by mouth 3 (three) times daily as needed for muscle spasms. 30 tablet 1  . DULoxetine (CYMBALTA) 30 MG capsule Take 1 capsule (30 mg total) by mouth daily. 30 capsule 1  . Levomefolate Glucosamine (METHYLFOLATE) 400 MCG CAPS Take 400 mcg by mouth daily. 30 capsule 11   No current facility-administered medications for this visit.    Allergies  Allergen Reactions  . Levaquin [Levofloxacin In D5w] Shortness Of Breath  . Shellfish Allergy Shortness Of Breath  . Sulfa Antibiotics Shortness Of Breath  . Iodine        Objective:  BP 113/70   Pulse 75   Wt 184 lb (83.5 kg)   BMI 27.98 kg/m   Gen: well-groomed, cooperative, not ill-appearing, no distress HEENT: normal conjunctiva, wearing glasses, no thyromegaly or tenderness, neck supple, trachea midline Pulm: Normal work of breathing, normal phonation, clear to auscultation bilaterally CV: Normal rate, regular rhythm, s1 and s2 distinct, no murmurs, clicks or rubs, no carotid bruit Neuro: alert and oriented x 3, EOM's intact, PERRLA, DTR's intact, normal tone, no tremor MSK: moving all extremities, normal gait and station, no peripheral edema Skin: warm and dry, no rashes or lesions on exposed skin Psych: good eye contact, labile mood, affect mood-congruent, normal speech and thought content  Depression screen [facility] 2/9 06/21/2017  Decreased Interest 1  Down, Depressed, Hopeless 1  PHQ - 2 Score 2   No flowsheet data found.    No results found for this or any previous visit (from the past 72 hour(s)). No results found.    Assessment and Plan: 31 y.o. female with   1. Encounter to establish care - reviewed PMH - reviewed health maintenance - Pap UTD  2. Fibromyalgia - cyclobenzaprine (FLEXERIL) 5 MG tablet; Take 1 tablet (5 mg total) by mouth 3 (three) times daily as needed for muscle spasms.  Dispense: 30 tablet; Refill: 1 - DULoxetine (CYMBALTA) 30 MG capsule; Take 1 capsule (30 mg total) by mouth daily.  Dispense: 30 capsule; Refill: 1 - Levomefolate Glucosamine (METHYLFOLATE) 400 MCG CAPS; Take 400 mcg by mouth daily.  Dispense: 30 capsule; Refill: 11  3. Family history of breast cancer gene mutation in first degree relative - patient has been tested for the gene and is negative - may be eligible for early mammogram/breast MRI  4. Dysthymia - Cymbalta 30mg  daily  No orders of the defined types were placed in this encounter.   Patient education and anticipatory guidance given Patient agrees with treatment plan Follow-up in 4 weeks or sooner as needed  Darlyne Russian PA-C

## 2017-06-21 NOTE — Patient Instructions (Addendum)
- Restart Cymbalta 30mg  every morning - Flexeril 1 tab three times daily as needed. May cause sedation so avoid use when you have to work or operate a vehicle - Try to get at least 3 days per week of moderate intensity exercise x 30 minutes - Follow-up in 4 weeks   Myofascial Pain Syndrome and Fibromyalgia Myofascial pain syndrome and fibromyalgia are both pain disorders. This pain may be felt mainly in your muscles.  Myofascial pain syndrome: ? Always has trigger points or tender points in the muscle that will cause pain when pressed. The pain may come and go. ? Usually affects your neck, upper back, and shoulder areas. The pain often radiates into your arms and hands.  Fibromyalgia: ? Has muscle pains and tenderness that come and go. ? Is often associated with fatigue and sleep disturbances. ? Has trigger points. ? Tends to be long-lasting (chronic), but is not life-threatening.  Fibromyalgia and myofascial pain are not the same. However, they often occur together. If you have both conditions, each can make the other worse. Both are common and can cause enough pain and fatigue to make day-to-day activities difficult. What are the causes? The exact causes of fibromyalgia and myofascial pain are not known. People with certain gene types may be more likely to develop fibromyalgia. Some factors can be triggers for both conditions, such as:  Spine disorders.  Arthritis.  Severe injury (trauma) and other physical stressors.  Being under a lot of stress.  A medical illness.  What are the signs or symptoms? Fibromyalgia The main symptom of fibromyalgia is widespread pain and tenderness in your muscles. This can vary over time. Pain is sometimes described as stabbing, shooting, or burning. You may have tingling or numbness, too. You may also have sleep problems and fatigue. You may wake up feeling tired and groggy (fibro fog). Other symptoms may include:  Bowel and bladder  problems.  Headaches.  Visual problems.  Problems with odors and noises.  Depression or mood changes.  Painful menstrual periods (dysmenorrhea).  Dry skin or eyes.  Myofascial pain syndrome Symptoms of myofascial pain syndrome include:  Tight, ropy bands of muscle.  Uncomfortable sensations in muscular areas, such as: ? Aching. ? Cramping. ? Burning. ? Numbness. ? Tingling. ? Muscle weakness.  Trouble moving certain muscles freely (range of motion).  How is this diagnosed? There are no specific tests to diagnose fibromyalgia or myofascial pain syndrome. Both can be hard to diagnose because their symptoms are common in many other conditions. Your health care provider may suspect one or both of these conditions based on your symptoms and medical history. Your health care provider will also do a physical exam. The key to diagnosing fibromyalgia is having pain, fatigue, and other symptoms for more than three months that cannot be explained by another condition. The key to diagnosing myofascial pain syndrome is finding trigger points in muscles that are tender and cause pain elsewhere in your body (referred pain). How is this treated? Treating fibromyalgia and myofascial pain often requires a team of health care providers. This usually starts with your primary provider and a physical therapist. You may also find it helpful to work with alternative health care providers, such as massage therapists or acupuncturists. Treatment for fibromyalgia may include medicines. This may include nonsteroidal anti-inflammatory drugs (NSAIDs), along with other medicines. Treatment for myofascial pain may also include:  NSAIDs.  Cooling and stretching of muscles.  Trigger point injections.  Sound wave (ultrasound) treatments to  stimulate muscles.  Follow these instructions at home:  Take medicines only as directed by your health care provider.  Exercise as directed by your health care  provider or physical therapist.  Try to avoid stressful situations.  Practice relaxation techniques to control your stress. You may want to try: ? Biofeedback. ? Visual imagery. ? Hypnosis. ? Muscle relaxation. ? Yoga. ? Meditation.  Talk to your health care provider about alternative treatments, such as acupuncture or massage treatment.  Maintain a healthy lifestyle. This includes eating a healthy diet and getting enough sleep.  Consider joining a support group.  Do not do activities that stress or strain your muscles. That includes repetitive motions and heavy lifting. Where to find more information:  National Fibromyalgia Association: www.fmaware.[place]: www.arthritis.org  American Chronic Pain Association: [employer] Contact a health care provider if:  You have new symptoms.  Your symptoms get worse.  You have side effects from your medicines.  You have trouble sleeping.  Your condition is causing depression or anxiety. This information is not intended to replace advice given to you by your health care provider. Make sure you discuss any questions you have with your health care provider. Document Released: 11/22/2005 Document Revised: 04/29/2016 Document Reviewed: 08/28/2014 Elsevier Interactive Patient Education  [employer].

## 2017-07-19 ENCOUNTER — Ambulatory Visit (INDEPENDENT_AMBULATORY_CARE_PROVIDER_SITE_OTHER): Payer: 59 | Admitting: Physician Assistant

## 2017-07-19 ENCOUNTER — Encounter: Payer: Self-pay | Admitting: Physician Assistant

## 2017-07-19 VITALS — BP 117/78 | HR 82 | Ht 68.0 in | Wt 183.0 lb

## 2017-07-19 DIAGNOSIS — M797 Fibromyalgia: Secondary | ICD-10-CM | POA: Diagnosis not present

## 2017-07-19 DIAGNOSIS — F341 Dysthymic disorder: Secondary | ICD-10-CM | POA: Diagnosis not present

## 2017-07-19 MED ORDER — DULOXETINE HCL 30 MG PO CPEP: 30.0000 mg | ORAL_CAPSULE | Freq: Every day | ORAL | 0 refills | Status: DC

## 2017-07-19 MED ORDER — CYCLOBENZAPRINE HCL 5 MG PO TABS: 5.0000 mg | ORAL_TABLET | Freq: Three times a day (TID) | ORAL | 2 refills | Status: DC | PRN

## 2017-07-19 NOTE — Progress Notes (Signed)
HPI:                                                                Bradley Bone is a 31 y.o. female who presents to [facility]: [facility] today for fibromyalgia follow-up  Patient has been taking Cymbalta and Flexeril nightly for 1 month without issues. Reports she has noticed an improvement in her mood and fatigue. Continues to endorse trigger points in her upper back and bilateral hips, worse at night. Pain does cause nighttime awakenings sometimes, but overall feels rested after adequate sleep time. Denies depressed mood or anhedonia.   Past Medical History:  Diagnosis Date  . Abnormal Pap smear of cervix 2008   cryo  . Fibromyalgia   . HGSIL on cytologic smear of cervix    s/p colposcopy, most recent pap normal  . LGSIL on Pap smear of cervix   . Multiple lipomas   . PCOS (polycystic ovarian syndrome)   . Trichomonal cervicitis    Past Surgical History:  Procedure Laterality Date  . CHOLECYSTECTOMY    . COLPOSCOPY    . KNEE ARTHROSCOPY Left 2002  . LIPOMA EXCISION    . PARTIAL GASTRECTOMY     Social History  Substance Use Topics  . Smoking status: Never Smoker  . Smokeless tobacco: Never Used  . Alcohol use 0.0 oz/week     Comment: occassional   family history includes Cancer (age of onset: 52) in her paternal aunt; Cancer (age of onset: 86) in her paternal aunt; Cancer (age of onset: 63) in her maternal aunt, maternal aunt, and mother; Cancer (age of onset: 31) in her maternal aunt; Cancer (age of onset: 37) in her maternal aunt; Diabetes in her mother; Hypertension in her father and mother; Non-Hodgkin's lymphoma in her father; Sarcoidosis in her father.  ROS: negative except as noted in the HPI  Medications: Current Outpatient Prescriptions  Medication Sig Dispense Refill  . cyclobenzaprine (FLEXERIL) 5 MG tablet Take 1 tablet (5 mg total) by mouth 3 (three) times daily as needed for muscle spasms. 30 tablet 1  . DULoxetine  (CYMBALTA) 30 MG capsule Take 1 capsule (30 mg total) by mouth daily. 30 capsule 1  . Levomefolate Glucosamine (METHYLFOLATE) 400 MCG CAPS Take 400 mcg by mouth daily. 30 capsule 11  . levonorgestrel (MIRENA) 20 MCG/24HR IUD 1 each by Intrauterine route once.     No current facility-administered medications for this visit.    Allergies  Allergen Reactions  . Levaquin [Levofloxacin In D5w] Shortness Of Breath  . Shellfish Allergy Shortness Of Breath  . Sulfa Antibiotics Shortness Of Breath  . Iodine      Objective:  BP 117/78   Pulse 82   Ht 5\' 8"  (1.727 m)   Wt 183 lb (83 kg)   BMI 27.83 kg/m  Gen:  Overweight female, not ill-appearing, no distress Pulm: Normal work of breathing, normal phonation Neuro: alert and oriented x 3, normal tone, no tremor MSK: moving all extremities, normal gait and station Psych: good eye contact, well-groomed, cooperative, flat affect, euthymic mood, normal speech and thought content    No results found for this or any previous visit (from the past 72 hour(s)). No results found.  Depression screen [facility] 2/9 07/19/2017  06/21/2017  Decreased Interest 0 1  Down, Depressed, Hopeless 0 1  PHQ - 2 Score 0 2  Altered sleeping 1 -  Tired, decreased energy 1 -  Change in appetite 0 -  Feeling bad or failure about yourself  0 -  Trouble concentrating 0 -  Moving slowly or fidgety/restless 0 -  Suicidal thoughts 0 -  PHQ-9 Score 2 -     Assessment and Plan: 31 y.o. female with   1. Fibromyalgia - patient satisfied with current medication regimen. Does not want to increase Cymbalta at this time. - discussed augmentation options to include Pregablin, Amitriptyline, or Savella as alternative to Cymbalta - continue regular low-impact aerobic exercise - DULoxetine (CYMBALTA) 30 MG capsule; Take 1 capsule (30 mg total) by mouth daily.  Dispense: 90 capsule; Refill: 0 - cyclobenzaprine (FLEXERIL) 5 MG tablet; Take 1 tablet (5 mg total) by mouth 3  (three) times daily as needed for muscle spasms.  Dispense: 90 tablet; Refill: 2  2. Dysthymia - PHQ2 score 0 - controlled on Cymbalta   Patient education and anticipatory guidance given Patient agrees with treatment plan Follow-up in 3 months for fibromyalgia or sooner as needed  Darlyne Russian PA-C

## 2017-07-19 NOTE — Patient Instructions (Addendum)
Continue low-impact aerobic exercise 3-5 days per week Continue Cymbalta 30mg  nightly and Flexeril 5-10mg  at bedtime as needed   Additional medication options in addition to cymbalta: - Amitriptyline at bedtime - may help with sleep - Pregablin (Lyrica) - Savella   Myofascial Pain Syndrome and Fibromyalgia Myofascial pain syndrome and fibromyalgia are both pain disorders. This pain may be felt mainly in your muscles.  Myofascial pain syndrome: ? Always has trigger points or tender points in the muscle that will cause pain when pressed. The pain may come and go. ? Usually affects your neck, upper back, and shoulder areas. The pain often radiates into your arms and hands.  Fibromyalgia: ? Has muscle pains and tenderness that come and go. ? Is often associated with fatigue and sleep disturbances. ? Has trigger points. ? Tends to be long-lasting (chronic), but is not life-threatening.  Fibromyalgia and myofascial pain are not the same. However, they often occur together. If you have both conditions, each can make the other worse. Both are common and can cause enough pain and fatigue to make day-to-day activities difficult. What are the causes? The exact causes of fibromyalgia and myofascial pain are not known. People with certain gene types may be more likely to develop fibromyalgia. Some factors can be triggers for both conditions, such as:  Spine disorders.  Arthritis.  Severe injury (trauma) and other physical stressors.  Being under a lot of stress.  A medical illness.  What are the signs or symptoms? Fibromyalgia The main symptom of fibromyalgia is widespread pain and tenderness in your muscles. This can vary over time. Pain is sometimes described as stabbing, shooting, or burning. You may have tingling or numbness, too. You may also have sleep problems and fatigue. You may wake up feeling tired and groggy (fibro fog). Other symptoms may include:  Bowel and bladder  problems.  Headaches.  Visual problems.  Problems with odors and noises.  Depression or mood changes.  Painful menstrual periods (dysmenorrhea).  Dry skin or eyes.  Myofascial pain syndrome Symptoms of myofascial pain syndrome include:  Tight, ropy bands of muscle.  Uncomfortable sensations in muscular areas, such as: ? Aching. ? Cramping. ? Burning. ? Numbness. ? Tingling. ? Muscle weakness.  Trouble moving certain muscles freely (range of motion).  How is this diagnosed? There are no specific tests to diagnose fibromyalgia or myofascial pain syndrome. Both can be hard to diagnose because their symptoms are common in many other conditions. Your health care provider may suspect one or both of these conditions based on your symptoms and medical history. Your health care provider will also do a physical exam. The key to diagnosing fibromyalgia is having pain, fatigue, and other symptoms for more than three months that cannot be explained by another condition. The key to diagnosing myofascial pain syndrome is finding trigger points in muscles that are tender and cause pain elsewhere in your body (referred pain). How is this treated? Treating fibromyalgia and myofascial pain often requires a team of health care providers. This usually starts with your primary provider and a physical therapist. You may also find it helpful to work with alternative health care providers, such as massage therapists or acupuncturists. Treatment for fibromyalgia may include medicines. This may include nonsteroidal anti-inflammatory drugs (NSAIDs), along with other medicines. Treatment for myofascial pain may also include:  NSAIDs.  Cooling and stretching of muscles.  Trigger point injections.  Sound wave (ultrasound) treatments to stimulate muscles.  Follow these instructions at home:  Take  medicines only as directed by your health care provider.  Exercise as directed by your health care  provider or physical therapist.  Try to avoid stressful situations.  Practice relaxation techniques to control your stress. You may want to try: ? Biofeedback. ? Visual imagery. ? Hypnosis. ? Muscle relaxation. ? Yoga. ? Meditation.  Talk to your health care provider about alternative treatments, such as acupuncture or massage treatment.  Maintain a healthy lifestyle. This includes eating a healthy diet and getting enough sleep.  Consider joining a support group.  Do not do activities that stress or strain your muscles. That includes repetitive motions and heavy lifting. Where to find more information:  National Fibromyalgia Association: www.fmaware.[place]: www.arthritis.org  American Chronic Pain Association: [employer] Contact a health care provider if:  You have new symptoms.  Your symptoms get worse.  You have side effects from your medicines.  You have trouble sleeping.  Your condition is causing depression or anxiety. This information is not intended to replace advice given to you by your health care provider. Make sure you discuss any questions you have with your health care provider. Document Released: 11/22/2005 Document Revised: 04/29/2016 Document Reviewed: 08/28/2014 Elsevier Interactive Patient Education  [employer].

## 2017-07-20 MED FILL — CYCLOBENZAPRINE 5 MG TABLET: 5 | 30 days supply | Qty: 90 | Fill #0

## 2017-07-20 MED FILL — DULoxetine HCL 30 MG CPEP: 30 | 90 days supply | Qty: 90 | Fill #0

## 2017-10-13 ENCOUNTER — Other Ambulatory Visit: Payer: Self-pay | Admitting: Physician Assistant

## 2017-10-13 DIAGNOSIS — M797 Fibromyalgia: Secondary | ICD-10-CM

## 2017-10-14 MED ORDER — DULOXETINE HCL 30 MG PO CPEP: 30.0000 mg | ORAL_CAPSULE | Freq: Every day | ORAL | 0 refills | Status: DC

## 2017-10-14 MED FILL — DULoxetine HCL 30 MG CPEP: 30 | 30 days supply | Qty: 30 | Fill #0

## 2017-10-19 ENCOUNTER — Ambulatory Visit: Payer: 59 | Admitting: Physician Assistant

## 2017-10-19 DIAGNOSIS — Z0189 Encounter for other specified special examinations: Secondary | ICD-10-CM

## 2017-11-16 ENCOUNTER — Other Ambulatory Visit: Payer: Self-pay | Admitting: Physician Assistant

## 2017-11-16 DIAGNOSIS — M797 Fibromyalgia: Secondary | ICD-10-CM

## 2017-11-17 ENCOUNTER — Other Ambulatory Visit: Payer: Self-pay | Admitting: Physician Assistant

## 2017-11-17 DIAGNOSIS — M797 Fibromyalgia: Secondary | ICD-10-CM

## 2017-11-17 MED ORDER — DULOXETINE HCL 30 MG PO CPEP: 30.0000 mg | ORAL_CAPSULE | Freq: Every day | ORAL | 2 refills | Status: DC

## 2017-11-17 MED FILL — DULoxetine HCL 30 MG CPEP: 30 | 30 days supply | Qty: 30 | Fill #0

## 2017-11-17 MED FILL — CYCLOBENZAPRINE 5 MG TABLET: 5 | 30 days supply | Qty: 90 | Fill #1

## 2017-11-17 NOTE — Telephone Encounter (Signed)
Please advise on medication refill...thanks  

## 2017-11-24 ENCOUNTER — Ambulatory Visit (INDEPENDENT_AMBULATORY_CARE_PROVIDER_SITE_OTHER): Payer: 59

## 2017-11-24 ENCOUNTER — Ambulatory Visit (INDEPENDENT_AMBULATORY_CARE_PROVIDER_SITE_OTHER): Payer: 59 | Admitting: Physician Assistant

## 2017-11-24 ENCOUNTER — Encounter: Payer: Self-pay | Admitting: Physician Assistant

## 2017-11-24 VITALS — BP 115/78 | HR 87 | Ht 68.0 in | Wt 197.0 lb

## 2017-11-24 DIAGNOSIS — Z13220 Encounter for screening for lipoid disorders: Secondary | ICD-10-CM | POA: Diagnosis not present

## 2017-11-24 DIAGNOSIS — M25474 Effusion, right foot: Secondary | ICD-10-CM

## 2017-11-24 DIAGNOSIS — M25441 Effusion, right hand: Secondary | ICD-10-CM

## 2017-11-24 DIAGNOSIS — M2550 Pain in unspecified joint: Secondary | ICD-10-CM

## 2017-11-24 DIAGNOSIS — Z0000 Encounter for general adult medical examination without abnormal findings: Secondary | ICD-10-CM | POA: Diagnosis not present

## 2017-11-24 DIAGNOSIS — E663 Overweight: Secondary | ICD-10-CM | POA: Diagnosis not present

## 2017-11-24 DIAGNOSIS — M25442 Effusion, left hand: Secondary | ICD-10-CM | POA: Diagnosis not present

## 2017-11-24 DIAGNOSIS — M25475 Effusion, left foot: Secondary | ICD-10-CM

## 2017-11-24 DIAGNOSIS — M79641 Pain in right hand: Secondary | ICD-10-CM | POA: Diagnosis not present

## 2017-11-24 DIAGNOSIS — M79642 Pain in left hand: Secondary | ICD-10-CM | POA: Diagnosis not present

## 2017-11-24 DIAGNOSIS — Z1329 Encounter for screening for other suspected endocrine disorder: Secondary | ICD-10-CM

## 2017-11-24 DIAGNOSIS — M79672 Pain in left foot: Secondary | ICD-10-CM | POA: Diagnosis not present

## 2017-11-24 DIAGNOSIS — M7989 Other specified soft tissue disorders: Secondary | ICD-10-CM | POA: Diagnosis not present

## 2017-11-24 MED ORDER — MELOXICAM 15 MG PO TABS: ORAL_TABLET | ORAL | 0 refills | Status: DC

## 2017-11-24 MED FILL — MELOXICAM 15 MG TABLET: 15 | 30 days supply | Qty: 30 | Fill #0

## 2017-11-24 NOTE — Progress Notes (Signed)
HPI:                                                                Kaylee Anderson is a 31 y.o. female who presents to [facility] today for annual physical exam  Current concerns include: joint pain  Pleasant 31 yo F with PMH of fibromyalgia reports joint pain and swelling in right thumb area beginning approximately 6 weeks ago. Has noted intermittent redness and warmth of the right thumb. Reports pain is moderate and persistent, worse on some days and she will take Motrin. She has since noticed mild swelling and tenderness in her left thumb and bilateral great toes. Denies fever, chills, or rashes. Reports family history of rheumatoid arthritis in MGM and gout in brother.   Health Maintenance Health Maintenance  Topic Date Due  . PAP SMEAR  11/12/2018  . TETANUS/TDAP  12/06/2021  . INFLUENZA VACCINE  Completed  . HIV Screening  Completed    GYN/Sexual Health  Menstrual status: IUD  Last pap smear: 11/13/2015, NILM, trichomonas vaginalis  History of abnormal pap smears: yes, HGSIL s/p colpo  Sexually active: yes  History of STI: yes, trichomonas  Current contraception: IUD   Past Medical History:  Diagnosis Date  . Abnormal Pap smear of cervix 2008   cryo  . Fibromyalgia   . HGSIL on cytologic smear of cervix    s/p colposcopy, most recent pap normal  . LGSIL on Pap smear of cervix   . Multiple lipomas   . PCOS (polycystic ovarian syndrome)   . Trichomonal cervicitis    Past Surgical History:  Procedure Laterality Date  . CHOLECYSTECTOMY    . COLPOSCOPY    . KNEE ARTHROSCOPY Left 2002  . LIPOMA EXCISION    . PARTIAL GASTRECTOMY     Social History   Tobacco Use  . Smoking status: Never Smoker  . Smokeless tobacco: Never Used  Substance Use Topics  . Alcohol use: Yes    Alcohol/week: 0.0 oz    Comment: occassional   family history includes Cancer (age of onset: 3) in her paternal aunt; Cancer (age of  onset: 93) in her paternal aunt; Cancer (age of onset: 44) in her maternal aunt, maternal aunt, and mother; Cancer (age of onset: 54) in her maternal aunt; Cancer (age of onset: 3) in her maternal aunt; Diabetes in her mother; Gout in her brother; Hypertension in her father and mother; Non-Hodgkin's lymphoma in her father; Rheum arthritis in her maternal grandmother; Sarcoidosis in her father.  ROS: negative except as noted in the HPI  Medications: Current Outpatient Medications  Medication Sig Dispense Refill  . cyclobenzaprine (FLEXERIL) 5 MG tablet Take 1 tablet (5 mg total) by mouth 3 (three) times daily as needed for muscle spasms. 90 tablet 2  . DULoxetine (CYMBALTA) 30 MG capsule Take 1 capsule (30 mg total) by mouth daily. 30 capsule 2  . Levomefolate Glucosamine (METHYLFOLATE) 400 MCG CAPS Take 400 mcg by mouth daily. 30 capsule 11  . levonorgestrel (MIRENA) 20 MCG/24HR IUD 1 each by Intrauterine route once.     No current facility-administered medications for this visit.    Allergies  Allergen Reactions  . Levaquin [Levofloxacin In D5w] Shortness Of Breath  . Shellfish  Allergy Shortness Of Breath  . Sulfa Antibiotics Shortness Of Breath  . Iodine        Objective:  BP 115/78   Pulse 87   Ht 5\' 8"  (1.727 m)   Wt 197 lb (89.4 kg)   BMI 29.95 kg/m  General Appearance:  Alert, cooperative, no distress, appropriate for age                            Head:  Normocephalic, without obvious abnormality                             Eyes:  PERRL, EOM's intact, conjunctiva and cornea clear                             Ears:  TM pearly gray color and semitransparent, external ear canals normal, both ears                            Nose:  Nares symmetrical                          Throat:  Lips, tongue, and mucosa are moist, pink, and intact; good dentition                             Neck:  Supple; symmetrical, trachea midline, no adenopathy; thyroid: no enlargement, symmetric, no  tenderness/mass/nodules                             Back:  Symmetrical, no curvature, ROM normal               Chest/Breast:  deferred                           Lungs:  Clear to auscultation bilaterally, respirations unlabored                             Heart:  regular rate & normal rhythm, S1 and S2 normal, no murmurs, rubs, or gallops                     Abdomen:  Soft, non-tender, no mass or organomegaly              Genitourinary:  deferred         Musculoskeletal:  Tone and strength strong and symmetrical, all extremities; visible swelling of the dorsum of right first phalang from the MCP to the IP joints, decreased pinch grip on the right compared to left; normal gait and station, negative Romberg                                       Lymphatic:  No adenopathy             Skin/Hair/Nails:  Skin warm, dry and intact, no rashes or abnormal dyspigmentation on limited exam                   Neurologic:  Alert and oriented x3, no  cranial nerve deficits, DTR's intact, sensation grossly intact, normal gait and station, no tremor Psych: well-groomed, cooperative, good eye contact, euthymic mood, affect mood-congruent, speech is articulate, and thought processes clear and goal-directed  Depression screen [facility] 2/9 11/24/2017 07/19/2017 06/21/2017  Decreased Interest 0 0 1  Down, Depressed, Hopeless 0 0 1  PHQ - 2 Score 0 0 2  Altered sleeping - 1 -  Tired, decreased energy - 1 -  Change in appetite - 0 -  Feeling bad or failure about yourself  - 0 -  Trouble concentrating - 0 -  Moving slowly or fidgety/restless - 0 -  Suicidal thoughts - 0 -  PHQ-9 Score - 2 -     No results found for this or any previous visit (from the past 72 hour(s)). No results found.    Assessment and Plan: 31 y.o. female with  1. Encounter for annual physical exam - reviewed health maintenance - pap smear UTD - immunizations UTD - negative PHQ2 - fasting labs ordered and pending - CBC with  Differential/Platelet - Comprehensive metabolic panel - Lipid Panel w/reflex Direct LDL - TSH  2. Polyarthralgia - rheumatologic work-up and x-rays ordered today. Starting conservative management with daily NSAID. Follow-up with Sports Medicine in 2 weeks - Sedimentation rate - CBC with Differential/Platelet - Comprehensive metabolic panel - Rheumatoid Arthritis Diagnostic Panel, Comprehensive - Uric acid  3. Swelling of first metatarsophalangeal (MTP) joint of both feet - Sedimentation rate - CBC with Differential/Platelet - Comprehensive metabolic panel - Rheumatoid Arthritis Diagnostic Panel, Comprehensive - Uric acid - DG Foot Complete Left - DG Foot Complete Right; Future  4. Swelling of finger joint of right hand - DG Hand Complete Right  5. Screening for lipid disorders - Lipid Panel w/reflex Direct LDL  6. Screening for thyroid disorder - TSH  7. Swelling of joint of left hand - DG Hand Complete Left  8. Overweight Wt Readings from Last 3 Encounters:  11/24/17 197 lb (89.4 kg)  07/19/17 183 lb (83 kg)  06/21/17 184 lb (83.5 kg)  - counseled on weight loss through decreasing caloric intake and increasing aerobic exercise  Patient education and anticipatory guidance given Patient agrees with treatment plan Follow-up in 2 weeks with sports medicine or sooner as needed  Darlyne Russian PA-C

## 2017-11-24 NOTE — Progress Notes (Signed)
Kaylee Anderson,  X-ray's surprisingly showed early osteoarthritis of the left thumb, but there was no evidence on the right. Both foot x-rays were normal. Dr. Darene Lamer will review these images as well. Treatment plan does not change.  Best, [name]

## 2017-11-24 NOTE — Patient Instructions (Signed)
Physical Activity Recommendations for modifying lipids and lowering blood pressure Engage in aerobic physical activity to reduce LDL-cholesterol, non-HDL-cholesterol, and blood pressure  Frequency: 3-4 sessions per week  Intensity: moderate to vigorous  Duration: 40 minutes on average  Physical Activity Recommendations for secondary prevention 1. Aerobic exercise  Frequency: 3-5 sessions per week  Intensity: 50-80% capacity  Duration: 20 - 60 minutes  Examples: walking, treadmill, cycling, rowing, stair climbing, and arm/leg ergometry  2. Resistance exercise  Frequency: 2-3 sessions per week  Intensity: 10-15 repetitions/set to moderate fatigue  Duration: 1-3 sets of 8-10 upper and lower body exercises  Examples: calisthenics, elastic bands, cuff/hand weights, dumbbels, free weights, wall pulleys, and weight machines  Heart-Healthy Lifestyle  Eating a diet rich in vegetables, fruits and whole grains: also includes low-fat dairy products, poultry, fish, legumes, and nuts; limit intake of sweets, sugar-sweetened beverages and red meats  Getting regular exercise  Maintaining a healthy weight  Not smoking or getting help quitting  Staying on top of your health; for some people, lifestyle changes alone may not be enough to prevent a heart attack or stroke. In these cases, taking a statin at the right dose will most likely be necessary  

## 2017-11-30 NOTE — Progress Notes (Signed)
Hi Virgia,  We are still waiting for the rheumatoid labs to come back, hence the delay in your results. Everything looks great so far. Will update you as soon as I receive those additional results.  Best, [name]

## 2017-12-01 LAB — TSH: TSH: 0.96 mIU/L

## 2017-12-01 LAB — CBC WITH DIFFERENTIAL/PLATELET
BASOS ABS: 29 {cells}/uL (ref 0–200)
Basophils Relative: 0.7 %
EOS PCT: 2.5 %
Eosinophils Absolute: 103 cells/uL (ref 15–500)
HCT: 35.7 % (ref 35.0–45.0)
Hemoglobin: 11.8 g/dL (ref 11.7–15.5)
Lymphs Abs: 1898 cells/uL (ref 850–3900)
MCH: 31.1 pg (ref 27.0–33.0)
MCHC: 33.1 g/dL (ref 32.0–36.0)
MCV: 93.9 fL (ref 80.0–100.0)
MONOS PCT: 6.4 %
MPV: 9.6 fL (ref 7.5–12.5)
NEUTROS PCT: 44.1 %
Neutro Abs: 1808 cells/uL (ref 1500–7800)
PLATELETS: 331 10*3/uL (ref 140–400)
RBC: 3.8 10*6/uL (ref 3.80–5.10)
RDW: 11.5 % (ref 11.0–15.0)
TOTAL LYMPHOCYTE: 46.3 %
WBC mixed population: 262 cells/uL (ref 200–950)
WBC: 4.1 10*3/uL (ref 3.8–10.8)

## 2017-12-01 LAB — LIPID PANEL W/REFLEX DIRECT LDL
Cholesterol: 115 mg/dL (ref <200)
HDL: 81 mg/dL (ref >50)
LDL Cholesterol (Calc): 20 mg/dL (calc)
Non-HDL Cholesterol (Calc): 34 mg/dL (calc) (ref <130)
TRIGLYCERIDES: 49 mg/dL (ref <150)
Total CHOL/HDL Ratio: 1.4 (calc) (ref <5.0)

## 2017-12-01 LAB — COMPREHENSIVE METABOLIC PANEL
AG Ratio: 1.3 (calc) (ref 1.0–2.5)
ALT: 5 U/L — AB (ref 6–29)
AST: 11 U/L (ref 10–30)
Albumin: 3.8 g/dL (ref 3.6–5.1)
Alkaline phosphatase (APISO): 95 U/L (ref 33–115)
BILIRUBIN TOTAL: 0.5 mg/dL (ref 0.2–1.2)
BUN: 12 mg/dL (ref 7–25)
CALCIUM: 8.9 mg/dL (ref 8.6–10.2)
CO2: 27 mmol/L (ref 20–32)
Chloride: 105 mmol/L (ref 98–110)
Creat: 0.93 mg/dL (ref 0.50–1.10)
Globulin: 3 g/dL (calc) (ref 1.9–3.7)
Glucose, Bld: 90 mg/dL (ref 65–99)
Potassium: 4 mmol/L (ref 3.5–5.3)
Sodium: 139 mmol/L (ref 135–146)
TOTAL PROTEIN: 6.8 g/dL (ref 6.1–8.1)

## 2017-12-01 LAB — RHEUMATOID ARTHRITIS DIAGNOSTIC PANEL, COMPREHENSIVE
Cyclic Citrullin Peptide Ab: <16 Units (ref <20)
RHEUMATOID FACTOR (IGA): 8 U — AB (ref <=6)
RHEUMATOID FACTOR (IGM): 17 U — AB (ref <=6)
Rheumatoid Factor (IgG): 12 U — ABNORMAL HIGH (ref <=6)
SSA (Ro) (ENA) Antibody, IgG: <1 AI (ref <1.0)
SSB (La) (ENA) Antibody, IgG: <1 AI (ref <1.0)

## 2017-12-01 LAB — URIC ACID: URIC ACID, SERUM: 3.9 mg/dL (ref 2.5–7.0)

## 2017-12-01 LAB — SEDIMENTATION RATE: SED RATE: 22 mm/h — AB (ref 0–20)

## 2017-12-02 ENCOUNTER — Encounter: Payer: Self-pay | Admitting: Physician Assistant

## 2017-12-02 NOTE — Progress Notes (Signed)
Hi Kaylee Anderson,  It appears based on your labs that you do have rheumatoid arthritis. Your fatigue and muscle ache is likely due to RA and not fibromyalgia. Because the symptoms of these conditions overlap and sometimes rheumatoid factor can be negative early in the disease, that is likely why you were diagnosed with fibromyalgia. Based on your recent x-rays and only mildly elevated ESR, I think it is reassuring that this is not an aggressive or advanced form of rheumatoid.  This is a lot to explain in a MyChart message. I would like to be able to answer questions you may have. Please schedule an appointment with me if you would like to discuss the results and next steps.  I am planning to refer you to [facility].  Best, [name]

## 2017-12-05 ENCOUNTER — Other Ambulatory Visit: Payer: Self-pay | Admitting: Physician Assistant

## 2017-12-05 DIAGNOSIS — M0579 Rheumatoid arthritis with rheumatoid factor of multiple sites without organ or systems involvement: Secondary | ICD-10-CM

## 2017-12-16 ENCOUNTER — Encounter: Payer: Self-pay | Admitting: Sports Medicine

## 2017-12-16 ENCOUNTER — Ambulatory Visit (INDEPENDENT_AMBULATORY_CARE_PROVIDER_SITE_OTHER): Payer: 59 | Admitting: Sports Medicine

## 2017-12-16 DIAGNOSIS — F329 Major depressive disorder, single episode, unspecified: Secondary | ICD-10-CM | POA: Diagnosis not present

## 2017-12-16 DIAGNOSIS — M2550 Pain in unspecified joint: Secondary | ICD-10-CM | POA: Diagnosis not present

## 2017-12-16 DIAGNOSIS — F32A Depression, unspecified: Secondary | ICD-10-CM | POA: Insufficient documentation

## 2017-12-16 MED ORDER — PREDNISONE 50 MG PO TABS
ORAL_TABLET | ORAL | 0 refills | Status: DC
Start: 2017-12-16 — End: 2017-12-16

## 2017-12-16 MED ORDER — PREDNISONE 50 MG PO TABS: ORAL_TABLET | ORAL | 0 refills | Status: DC

## 2017-12-16 MED ORDER — IBUPROFEN 200 MG PO CAPS: 800.0000 mg | ORAL_CAPSULE | Freq: Three times a day (TID) | ORAL | 0 refills | Status: DC

## 2017-12-16 MED ORDER — VORTIOXETINE HBR 10 MG PO TABS: 1.0000 | ORAL_TABLET | Freq: Every day | ORAL | 3 refills | Status: DC

## 2017-12-16 MED FILL — predniSONE 50 MG TABS: 50 | 5 days supply | Qty: 5 | Fill #0

## 2017-12-16 NOTE — Assessment & Plan Note (Signed)
Excessive weight gain and insufficient relief with Cymbalta, has tried Celexa, Lexapro, Zoloft in the past. Switching to Trintellix. Starting at 10 mg, return in 1 month.

## 2017-12-16 NOTE — Progress Notes (Signed)
Subjective:    I'm seeing this patient as a consultation for:   Nelson Chimes, PA-C  CC: Polyarthralgia  HPI: This is a very pleasant 32 year old female, she has had a decades long history of multiple joint aches, pains, swelling, widespread fatigue.  And a bit of mood disorder without suicidal homicidal ideation.  She has seen rheumatologist, neurologist in the past, labs have all been negative, she has had an EMG/nerve conduction study, all normal.  More recently she saw Nelson Chimes, PA-C, rheumatoid testing did show a mildly positive rheumatoid factor but normal CCP.  She was placed on meloxicam appropriately, she feels as though meloxicam is really not helped much, she has been on Cymbalta, but feels as though she is gaining too much weight, only on 30 mg.  Only gets minimal morning stiffness, has not had steroids in a long time to evaluate response.  Pain is fairly widespread in the neck, back, shoulders, but also in the metacarpophalangeal joints.  I reviewed the past medical history, family history, social history, surgical history, and allergies today and no changes were needed.  Please see the problem list section below in epic for further details.  Past Medical History: Past Medical History:  Diagnosis Date  . Abnormal Pap smear of cervix 2008   cryo  . Dysthymia 06/21/2017  . Fibromyalgia   . HGSIL on cytologic smear of cervix    s/p colposcopy, most recent pap normal  . LGSIL on Pap smear of cervix   . Multiple lipomas   . PCOS (polycystic ovarian syndrome)   . Rheumatoid arthritis ([place]) 06/21/2017  . Trichomonal cervicitis    Past Surgical History: Past Surgical History:  Procedure Laterality Date  . CHOLECYSTECTOMY    . COLPOSCOPY    . KNEE ARTHROSCOPY Left 2002  . LIPOMA EXCISION    . PARTIAL GASTRECTOMY     Social History: Social History   Socioeconomic History  . Marital status: Single    Spouse name: None  . Number of children: None  . Years of  education: None  . Highest education level: None  Social Needs  . Financial resource strain: None  . Food insecurity - worry: None  . Food insecurity - inability: None  . Transportation needs - medical: None  . Transportation needs - non-medical: None  Occupational History  . Occupation: Ship broker  Tobacco Use  . Smoking status: Never Smoker  . Smokeless tobacco: Never Used  Substance and Sexual Activity  . Alcohol use: Yes    Alcohol/week: 0.0 oz    Comment: occassional  . Drug use: No  . Sexual activity: Yes    Partners: Male    Birth control/protection: IUD    Comment: condoms and Mirena  Other Topics Concern  . None  Social History Narrative  . None   Family History: Family History  Problem Relation Age of Onset  . Hypertension Mother   . Diabetes Mother   . Cancer Mother 87       breast  . Sarcoidosis Father   . Non-Hodgkin's lymphoma Father   . Hypertension Father   . Cancer Paternal Aunt 42       breast   . Cancer Paternal Aunt 79       ovarian  . Cancer Maternal Aunt 48       breast  . Cancer Maternal Aunt 50       breast and ovarian  . Cancer Maternal Aunt 49       breast  .  Cancer Maternal Aunt 48       breast  . Gout Brother   . Rheum arthritis Maternal Grandmother    Allergies: Allergies  Allergen Reactions  . Levaquin [Levofloxacin In D5w] Shortness Of Breath  . Shellfish Allergy Shortness Of Breath  . Sulfa Antibiotics Shortness Of Breath  . Iodine    Medications: See med rec.  Review of Systems: No headache, visual changes, nausea, vomiting, diarrhea, constipation, dizziness, abdominal pain, skin rash, fevers, chills, night sweats, weight loss, swollen lymph nodes, body aches, joint swelling, muscle aches, chest pain, shortness of breath, mood changes, visual or auditory hallucinations.   Objective:   General: Well Developed, well nourished, and in no acute distress.  Neuro:  Extra-ocular muscles intact, able to move all 4 extremities,  sensation grossly intact.  Deep tendon reflexes tested were normal. Psych: Alert and oriented, mood congruent with affect. ENT:  Ears and nose appear unremarkable.  Hearing grossly normal. Neck: Unremarkable overall appearance, trachea midline.  No visible thyroid enlargement. Eyes: Conjunctivae and lids appear unremarkable.  Pupils equal and round. Skin: Warm and dry, no rashes noted.  Cardiovascular: Pulses palpable, no extremity edema. Hands: No visible or palpable synovitis.  Impression and Recommendations:   This case required medical decision making of moderate complexity.  Polyarthralgia Polyarthralgia with mildly elevated rheumatoid factor titers but a negative CCP. Minimally elevated ESR to 22. This does not meet criteria for rheumatoid arthritis but I do think she is somewhere in the gray area of normal and autoimmune disease. Switching from meloxicam to ibuprofen 800 and adding a burst of prednisone, to rheumatoid arthritis should respond dramatically to prednisone. Return to see me in 1 month. She does have a follow-up coming up with rheumatology.  Depression/fibromyalgia/chronic fatigue syndrome Excessive weight gain and insufficient relief with Cymbalta, has tried Celexa, Lexapro, Zoloft in the past. Switching to Trintellix. Starting at 10 mg, return in 1 month.  ___________________________________________ Gwen Her. Dianah Field, M.D., ABFM., CAQSM. Primary Care and [place] Instructor of [place] [facility] [employer] [facility]

## 2017-12-16 NOTE — Assessment & Plan Note (Signed)
Polyarthralgia with mildly elevated rheumatoid factor titers but a negative CCP. Minimally elevated ESR to 22. This does not meet criteria for rheumatoid arthritis but I do think she is somewhere in the gray area of normal and autoimmune disease. Switching from meloxicam to ibuprofen 800 and adding a burst of prednisone, to rheumatoid arthritis should respond dramatically to prednisone. Return to see me in 1 month. She does have a follow-up coming up with rheumatology.

## 2017-12-20 ENCOUNTER — Telehealth: Payer: Self-pay | Admitting: *Deleted

## 2017-12-20 NOTE — Telephone Encounter (Signed)
Pre Authorization sent to cover my meds. [id number])

## 2017-12-23 NOTE — Telephone Encounter (Signed)
trintellix has been approved for a max of 12 fills from 12/22/2017-12/21/2018. Pharmacy notified. Patient notified

## 2018-01-10 ENCOUNTER — Ambulatory Visit (INDEPENDENT_AMBULATORY_CARE_PROVIDER_SITE_OTHER): Payer: 59 | Admitting: Obstetrics & Gynecology

## 2018-01-10 ENCOUNTER — Encounter: Payer: Self-pay | Admitting: Obstetrics & Gynecology

## 2018-01-10 VITALS — BP 120/74 | HR 80 | Ht 68.0 in | Wt 203.0 lb

## 2018-01-10 DIAGNOSIS — Z124 Encounter for screening for malignant neoplasm of cervix: Secondary | ICD-10-CM | POA: Diagnosis not present

## 2018-01-10 DIAGNOSIS — Z1151 Encounter for screening for human papillomavirus (HPV): Secondary | ICD-10-CM | POA: Diagnosis not present

## 2018-01-10 DIAGNOSIS — Z01419 Encounter for gynecological examination (general) (routine) without abnormal findings: Secondary | ICD-10-CM

## 2018-01-10 NOTE — Progress Notes (Signed)
Pt said she got her Mirena in Nov 2013. Pt wants another baby in a year. Wants to discuss [facility] (not IUD) until she's ready to get pregnant.

## 2018-01-10 NOTE — Progress Notes (Signed)
Subjective:     Kaylee Anderson is a 32 y.o. female here for a routine exam.  Current complaints: unilateral joint swelling, had "abnml panel" and being referred to rhem.  Wants to discuss with husband about 1 year method.  Pt understands that pregnancy is unlikely but possible.   Gynecologic History No LMP recorded (lmp unknown). Patient is not currently having periods (Reason: IUD). Contraception: IUD Last Pap: nml per patient--History of cryo.    Obstetric History OB History  Gravida Para Term Preterm AB Living  _0 SAB TAB Ectopic Multiple Live Births               # Outcome Date GA Lbr Len/2nd Weight Sex Delivery Anes PTL Lv  1 Term 10/20/12   6 lb 4 oz (2.835 kg)  Vag-Spont None N        The following portions of the patient's history were reviewed and updated as appropriate: allergies, current medications, past family history, past medical history, past social history, past surgical history and problem list.  Review of Systems Pertinent items noted in HPI and remainder of comprehensive ROS otherwise negative.    Objective:      Vitals:   01/10/18 1445  BP: 120/74  Pulse: 80  Weight: 203 lb (92.1 kg)  Height: _1  (1.727 m)   Vitals:  WNL General appearance: alert, cooperative and no distress  HEENT: Normocephalic, without obvious abnormality, atraumatic Eyes: negative Throat: lips, mucosa, and tongue normal; teeth and gums normal  Respiratory: Clear to auscultation bilaterally  CV: Regular rate and rhythm  Breasts:  Normal appearance, no masses or tenderness, no nipple retraction or dimpling  GI: Soft, non-tender; bowel sounds normal; no masses,  no organomegaly  GU: External Genitalia:  Tanner V, labiamajora onto perinuem posterior is white and thickened.  Fissure between left labia major and minora.  Not tender.   No other ulcerations Urethra:  No prolapse   Vagina: Pink, normal rugae, no blood or discharge  Cervix: No CMT, no lesion  Uterus:   Normal size and contour, non tender  Adnexa: Normal, no masses, non tender  Musculoskeletal: No edema, redness or tenderness in the calves or thighs  Skin: No lesions or rash  Lymphatic: Axillary adenopathy: none     Psychiatric: Normal mood and behavior        Assessment:    Healthy female exam.   IUD at 5 years ?Lichen sclerosis vs psoriasis of vulva   Plan:   Pap w/ cotesting Return to office for biopsy BRCA negative--will discuss with breast radiologist if increased surveillance needed since mom is BRCA positive and his breast cancer in late 40s

## 2018-01-13 ENCOUNTER — Ambulatory Visit: Payer: 59 | Admitting: Sports Medicine

## 2018-01-13 LAB — CYTOLOGY - PAP
DIAGNOSIS: NEGATIVE
HPV: NOT DETECTED

## 2018-01-30 DIAGNOSIS — R768 Other specified abnormal immunological findings in serum: Secondary | ICD-10-CM | POA: Diagnosis not present

## 2018-01-30 DIAGNOSIS — E669 Obesity, unspecified: Secondary | ICD-10-CM | POA: Diagnosis not present

## 2018-01-30 DIAGNOSIS — M7989 Other specified soft tissue disorders: Secondary | ICD-10-CM | POA: Diagnosis not present

## 2018-01-30 DIAGNOSIS — Z6830 Body mass index (BMI) 30.0-30.9, adult: Secondary | ICD-10-CM | POA: Diagnosis not present

## 2018-01-30 DIAGNOSIS — M2550 Pain in unspecified joint: Secondary | ICD-10-CM | POA: Diagnosis not present

## 2018-02-01 ENCOUNTER — Ambulatory Visit (INDEPENDENT_AMBULATORY_CARE_PROVIDER_SITE_OTHER): Payer: 59 | Admitting: Obstetrics & Gynecology

## 2018-02-01 ENCOUNTER — Encounter: Payer: Self-pay | Admitting: Obstetrics & Gynecology

## 2018-02-01 VITALS — BP 120/78 | HR 71 | Wt 205.0 lb

## 2018-02-01 DIAGNOSIS — L280 Lichen simplex chronicus: Secondary | ICD-10-CM | POA: Diagnosis not present

## 2018-02-01 DIAGNOSIS — N909 Noninflammatory disorder of vulva and perineum, unspecified: Secondary | ICD-10-CM | POA: Diagnosis not present

## 2018-02-01 DIAGNOSIS — Z975 Presence of (intrauterine) contraceptive device: Secondary | ICD-10-CM

## 2018-02-01 DIAGNOSIS — R234 Changes in skin texture: Secondary | ICD-10-CM

## 2018-02-01 NOTE — Progress Notes (Signed)
   Subjective:    Patient ID: Kaylee Anderson, female    DOB: 08-18-1986, 32 y.o.   MRN: [id number]  HPI  Pt here for vulvar biopsy.  Pt also elects to keep IUD past 5 year mark.  She wants to see what is happening with her rheumatological issues and this biopsy.  Pt staes she understands that there could be decreased efficacy.    Pt says vulvar whiteness and irritation waxes and wanes.  It is btter today than last visit day.  Review of Systems  Constitutional: Negative.   Cardiovascular: Negative.   Gastrointestinal: Negative.   Genitourinary: Positive for vaginal pain. Negative for menstrual problem.  Musculoskeletal: Positive for arthralgias.       Objective:   Physical Exam  Constitutional: She is oriented to person, place, and time. She appears well-developed and well-nourished. No distress.  HENT:  Head: Normocephalic and atraumatic.  Eyes: Conjunctivae are normal.  Cardiovascular: Normal rate.  Pulmonary/Chest: Effort normal.  Abdominal: Soft. There is no tenderness.  Genitourinary:     Musculoskeletal: She exhibits no edema.  Neurological: She is alert and oriented to person, place, and time.  Skin: Skin is warm and dry.  Psychiatric: She has a normal mood and affect.  Vitals reviewed.  Vitals:   02/01/18 0946  BP: 120/78  Pulse: 71  Weight: 205 lb (93 kg)   Assessment & Plan:   32 yo female with Mirena IUD at 6 years and vulvar lesion  1-Vulvar biopsy 2-Continue to discus contraception plan,

## 2018-02-01 NOTE — Progress Notes (Signed)
RT vulvar biopsy

## 2018-02-21 ENCOUNTER — Other Ambulatory Visit: Payer: Self-pay | Admitting: Obstetrics & Gynecology

## 2018-02-21 ENCOUNTER — Telehealth: Payer: Self-pay | Admitting: *Deleted

## 2018-02-21 MED ORDER — TRIAMCINOLONE 0.1 % CREAM:EUCERIN CREAM 1:1: TOPICAL_CREAM | Freq: Two times a day (BID) | CUTANEOUS | Status: DC

## 2018-02-21 NOTE — Telephone Encounter (Signed)
-----   Message from Guss Bunde, MD sent at 02/21/2018 39:43 AM EDT ----- Lichen simplex chronicus--no psoriasis noted.   Topical corticosteroids or Tacrolimus 0.03% and 0.1% ointment are potential treatments.  Will start with corticosteroids with emollients.

## 2018-02-21 NOTE — Telephone Encounter (Signed)
LM on voicmeail that her pathology report showed Lichen Simplex and Dr Gala Romney has sent a RX to [facility].

## 2018-02-24 ENCOUNTER — Encounter: Payer: Self-pay | Admitting: Obstetrics & Gynecology

## 2018-03-02 ENCOUNTER — Other Ambulatory Visit: Payer: Self-pay | Admitting: *Deleted

## 2018-03-02 MED ORDER — TRIAMCINOLONE 0.1 % CREAM:EUCERIN CREAM 1:1: 1.0000 "application " | TOPICAL_CREAM | Freq: Two times a day (BID) | CUTANEOUS | 3 refills | Status: DC

## 2018-04-11 DIAGNOSIS — R768 Other specified abnormal immunological findings in serum: Secondary | ICD-10-CM | POA: Diagnosis not present

## 2018-04-11 DIAGNOSIS — Z6830 Body mass index (BMI) 30.0-30.9, adult: Secondary | ICD-10-CM | POA: Diagnosis not present

## 2018-04-11 DIAGNOSIS — M2550 Pain in unspecified joint: Secondary | ICD-10-CM | POA: Diagnosis not present

## 2018-04-11 DIAGNOSIS — E669 Obesity, unspecified: Secondary | ICD-10-CM | POA: Diagnosis not present

## 2018-05-07 ENCOUNTER — Encounter: Payer: Self-pay | Admitting: Family Medicine

## 2018-05-07 ENCOUNTER — Ambulatory Visit: Payer: Self-pay | Admitting: Family Medicine

## 2018-05-07 VITALS — BP 118/80 | HR 82 | Temp 98.7°F | Resp 20 | Ht 68.0 in | Wt 204.2 lb

## 2018-05-07 DIAGNOSIS — Z111 Encounter for screening for respiratory tuberculosis: Secondary | ICD-10-CM

## 2018-05-07 DIAGNOSIS — Z0000 Encounter for general adult medical examination without abnormal findings: Secondary | ICD-10-CM

## 2018-05-07 NOTE — Progress Notes (Signed)
Subjective:  Kaylee Anderson is a 32 y.o. female who presents for basic physical exam reuqired for entry into graduate school. Patient denies any other current health related concerns and acknowledges current medical problem list is up to date. 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 She is also requesting a TDAP vaccination (which she will attempt to obtain through employee health) and TB skin test here in office today. Immunization History  Administered Date(s) Administered  . Influenza-Unspecified 08/22/2017  . PPD Test 05/07/2018  . Tdap 12/07/2011     Immunization History  Administered Date(s) Administered  . Influenza-Unspecified 08/22/2017  . Tdap 12/07/2011   Patient Active Problem List   Diagnosis Date Noted  . Depression/fibromyalgia/chronic fatigue syndrome 12/16/2017  . Overweight (BMI 25.0-29.9) 11/24/2017  . Polyarthralgia 11/24/2017  . IUD (intrauterine device) in place 06/21/2017  . Family history of breast cancer gene mutation in first degree relative 06/21/2017  . Family history of cancer 12/08/2015    Past Surgical History:  Procedure Laterality Date  . CHOLECYSTECTOMY    . COLPOSCOPY    . KNEE ARTHROSCOPY Left 2002  . LIPOMA EXCISION    . PARTIAL GASTRECTOMY      Social History   Tobacco Use  . Smoking status: Never Smoker  . Smokeless tobacco: Never Used  Substance Use Topics  . Alcohol use: Yes    Alcohol/week: 0.0 oz    Comment: occassional  . Drug use: No    Allergies  Allergen Reactions  . Levaquin [Levofloxacin In D5w] Shortness Of Breath  . Shellfish Allergy Shortness Of Breath  . Sulfa Antibiotics Shortness Of Breath  . Iodine     Current Outpatient Medications  Medication Sig Dispense Refill  . cyclobenzaprine (FLEXERIL) 5 MG tablet Take 1 tablet (5 mg total) by mouth 3 (three) times daily as needed for muscle  spasms. 90 tablet 2  . Ibuprofen 200 MG CAPS Take 4 capsules (800 mg total) by mouth 3 (three) times daily. 120 each 0  . levonorgestrel (MIRENA) 20 MCG/24HR IUD 1 each by Intrauterine route once.    . Levomefolate Glucosamine (METHYLFOLATE) 400 MCG CAPS Take 400 mcg by mouth daily. (Patient not taking: Reported on 01/10/2018) 30 capsule 11  . predniSONE (DELTASONE) 50 MG tablet One tab PO daily for 5 days. (Patient not taking: Reported on 01/10/2018) 5 tablet 0  . Triamcinolone Acetonide (TRIAMCINOLONE 0.1 % CREAM : EUCERIN) CREA Apply 1 application topically 2 (two) times daily. (Patient not taking: Reported on 05/07/2018) 1 each 3  . vortioxetine HBr (TRINTELLIX) 10 MG TABS Take 1 tablet (10 mg total) by mouth daily. (Patient not taking: Reported on 01/10/2018) 30 tablet 3   No current facility-administered medications for this visit.    Review of Systems  Constitutional: Negative.   HENT: Negative.   Eyes: Negative.   Respiratory: Negative.   Cardiovascular: Negative.   Gastrointestinal: Negative.   Genitourinary: Negative.   Musculoskeletal: Positive for myalgias.  Skin: Negative.   Neurological: Negative.    BP 118/80 (BP Location: Right Arm, Patient Position: Sitting, Cuff Size: Normal)   Pulse 82   Temp 98.7 F (37.1 C) (Oral)   Resp 20   Ht 5\' 8"  (1.727 m)   Wt 204 lb 3.2 oz (92.6 kg)   SpO2 98%   BMI 31.05 kg/m   Objective:  BP 118/80 (BP Location: Right Arm, Patient Position: Sitting, Cuff  Size: Normal)   Pulse 82   Temp 98.7 F (37.1 C) (Oral)   Resp 20   Ht 5\' 8"  (1.727 m)   Wt 204 lb 3.2 oz (92.6 kg)   SpO2 98%   BMI 31.05 kg/m   General Appearance:  Alert, cooperative, no distress, appears stated age  Head:  Normocephalic, without obvious abnormality, atraumatic  Eyes:  PERRL, conjunctiva/corneas clear, EOM's intact  both eyes  Ears:  Normal TM's and external ear canals, both ears  Nose: Nares normal, septum midline,mucosa normal, no drainage or sinus tenderness   Throat: Lips, mucosa, and tongue normal; teeth and gums normal  Neck: Supple, symmetrical, trachea midline, no adenopathy;  thyroid: not enlarged, symmetric, no tenderness/mass/nodules; no carotid bruit or JVD  Back:   Symmetric, no curvature, ROM normal, no CVA tenderness  Lungs:   Clear to auscultation bilaterally, respirations unlabored  Heart:  Regular rate and rhythm, S1 and S2 normal, no murmur, rub, or gallop  Abdomen:   Soft, non-tender, bowel sounds active all four quadrants,  no masses, no organomegaly  Pelvic: Deferred  Extremities: Extremities normal, atraumatic, no cyanosis or edema  Pulses: 2+ radial   Skin: Skin color, texture, turgor normal, no rashes or lesions  Neurologic: Normal   Assessment and Plan:  Basic Physical Exam:  Patient education provided.  Patient will follow up with PCP for any additional recommended diagnostic screening or exam. Return in 48-72 hours to have TB skin test read. After further review of immunization history, patient only requires a tetanus booster oppose to TDAP.  Carroll Sage. Kenton Kingfisher, MSN, FNP-C [address] # [place]  [place], [place] [address] [phone]

## 2018-05-07 NOTE — Patient Instructions (Signed)
Preventive Care 18-39 Years, Female Preventive care refers to lifestyle choices and visits with your health care provider that can promote health and wellness. What does preventive care include?  A yearly physical exam. This is also called an annual well check.  Dental exams once or twice a year.  Routine eye exams. Ask your health care provider how often you should have your eyes checked.  Personal lifestyle choices, including: ? Daily care of your teeth and gums. ? Regular physical activity. ? Eating a healthy diet. ? Avoiding tobacco and drug use. ? Limiting alcohol use. ? Practicing safe sex. ? Taking vitamin and mineral supplements as recommended by your health care provider. What happens during an annual well check? The services and screenings done by your health care provider during your annual well check will depend on your age, overall health, lifestyle risk factors, and family history of disease. Counseling Your health care provider may ask you questions about your:  Alcohol use.  Tobacco use.  Drug use.  Emotional well-being.  Home and relationship well-being.  Sexual activity.  Eating habits.  Work and work Statistician.  Method of birth control.  Menstrual cycle.  Pregnancy history.  Screening You may have the following tests or measurements:  Height, weight, and BMI.  Diabetes screening. This is done by checking your blood sugar (glucose) after you have not eaten for a while (fasting).  Blood pressure.  Lipid and cholesterol levels. These may be checked every 5 years starting at age 66.  Skin check.  Hepatitis C blood test.  Hepatitis B blood test.  Sexually transmitted disease (STD) testing.  BRCA-related cancer screening. This may be done if you have a family history of breast, ovarian, tubal, or peritoneal cancers.  Pelvic exam and Pap test. This may be done every 3 years starting at age 40. Starting at age 59, this may be done every 5  years if you have a Pap test in combination with an HPV test.  Discuss your test results, treatment options, and if necessary, the need for more tests with your health care provider. Vaccines Your health care provider may recommend certain vaccines, such as:  Influenza vaccine. This is recommended every year.  Tetanus, diphtheria, and acellular pertussis (Tdap, Td) vaccine. You may need a Td booster every 10 years.  Varicella vaccine. You may need this if you have not been vaccinated.  HPV vaccine. If you are 69 or younger, you may need three doses over 6 months.  Measles, mumps, and rubella (MMR) vaccine. You may need at least one dose of MMR. You may also need a second dose.  Pneumococcal 13-valent conjugate (PCV13) vaccine. You may need this if you have certain conditions and were not previously vaccinated.  Pneumococcal polysaccharide (PPSV23) vaccine. You may need one or two doses if you smoke cigarettes or if you have certain conditions.  Meningococcal vaccine. One dose is recommended if you are age 27-21 years and a first-year college student living in a residence hall, or if you have one of several medical conditions. You may also need additional booster doses.  Hepatitis A vaccine. You may need this if you have certain conditions or if you travel or work in places where you may be exposed to hepatitis A.  Hepatitis B vaccine. You may need this if you have certain conditions or if you travel or work in places where you may be exposed to hepatitis B.  Haemophilus influenzae type b (Hib) vaccine. You may need this if  you have certain risk factors.  Talk to your health care provider about which screenings and vaccines you need and how often you need them. This information is not intended to replace advice given to you by your health care provider. Make sure you discuss any questions you have with your health care provider. Document Released: 01/18/2002 Document Revised: 08/11/2016  Document Reviewed: 09/23/2015 Elsevier Interactive Patient Education  [employer].

## 2018-05-09 LAB — TB SKIN TEST
Induration: 0 mm
TB Skin Test: NEGATIVE

## 2018-07-03 ENCOUNTER — Ambulatory Visit (INDEPENDENT_AMBULATORY_CARE_PROVIDER_SITE_OTHER): Payer: 59 | Admitting: Physician Assistant

## 2018-07-03 ENCOUNTER — Encounter: Payer: Self-pay | Admitting: Physician Assistant

## 2018-07-03 VITALS — BP 108/74 | HR 82 | Temp 98.5°F | Wt 203.0 lb

## 2018-07-03 DIAGNOSIS — Z91013 Allergy to seafood: Secondary | ICD-10-CM

## 2018-07-03 DIAGNOSIS — Z91018 Allergy to other foods: Secondary | ICD-10-CM | POA: Insufficient documentation

## 2018-07-03 DIAGNOSIS — Z889 Allergy status to unspecified drugs, medicaments and biological substances status: Secondary | ICD-10-CM | POA: Insufficient documentation

## 2018-07-03 DIAGNOSIS — Z9189 Other specified personal risk factors, not elsewhere classified: Secondary | ICD-10-CM | POA: Diagnosis not present

## 2018-07-03 DIAGNOSIS — L299 Pruritus, unspecified: Secondary | ICD-10-CM | POA: Diagnosis not present

## 2018-07-03 MED ORDER — HYDROCORTISONE-ACETIC ACID 1-2 % OT SOLN: 5.0000 [drp] | Freq: Three times a day (TID) | OTIC | 0 refills | Status: DC

## 2018-07-03 MED ORDER — HYDROCORTISONE-ACETIC ACID 1-2 % OT SOLN: 5.0000 [drp] | Freq: Three times a day (TID) | OTIC | 0 refills | Status: AC

## 2018-07-03 MED ORDER — EPINEPHRINE 0.3 MG/0.3ML IJ SOAJ: 0.3000 mg | Freq: Once | INTRAMUSCULAR | 1 refills | Status: DC | PRN

## 2018-07-03 MED FILL — HYDROCORTISON-ACETIC ACID S: 1-2 | 7 days supply | Qty: 10 | Fill #0

## 2018-07-03 MED FILL — EPINEPHRINE 0.3 MG AUTO-INJ: 0.3 | 30 days supply | Qty: 2 | Fill #0

## 2018-07-03 NOTE — Patient Instructions (Addendum)
- apply ear drops to both ears 5 times daily for 3-5 days, then as needed - take a non-drowsy antihistamine daily such as generic Zyrtec, Claritin or Allegra - follow-up with allergist   Anaphylactic Reaction, Adult An anaphylactic reaction (anaphylaxis) is a sudden, severe allergic reaction that affects multiple areas of the body. Affected areas of the body may include the skin, mouth, lungs, heart, or gut (digestive system). Anaphylaxis can be life-threatening. This condition requires immediate medical attention, and sometimes hospitalization. What are the causes? This condition is caused by exposure to a substance that you are allergic to (allergen). In response to this exposure, the body releases proteins (antibodies) and other compounds, such as histamine, into the bloodstream. This causes swelling in certain tissues and loss of blood pressure to important areas, such as the heart and lungs. Common allergens that can cause anaphylaxis include:  Medicines.  Foods, especially peanuts, wheat, shellfish, milk, and eggs.  Insect bites or stings.  Blood or parts of blood, including plasma, immunoglobulins, or serum.  Chemicals, such as dyes, latex, and contrast material that is used for medical tests.  What increases the risk? This condition is more likely to occur in people who:  Have allergies.  Have had anaphylaxis before.  Have a family history of anaphylaxis.  Have certain medical conditions, including asthma and eczema.  What are the signs or symptoms? Symptoms of anaphylaxis include:  Nasal congestion.  Headache.  Flushed face.  Tingling in the mouth.  An itchy, red rash.  Swelling of the eyes, lips, face, or tongue.  Swelling of the back of the mouth and the throat.  Wheezing.  A hoarse voice.  Itchy, red, swollen areas of skin (hives).  Dizziness or light-headedness.  Fainting.  Anxiety or confusion.  Abdominal or chest pain.  Difficulty  breathing, speaking, or swallowing.  Chest or throat tightness.  Fast or irregular heartbeats (palpitations).  Vomiting.  Diarrhea.  How is this diagnosed? This condition is diagnosed based on a physical exam and your history of recent exposure to allergens. You may be referred for follow-up testing by a health care provider who specializes in allergies. This testing can confirm the diagnosis and determine which substances you are allergic to. Testing may include:  Skin tests. These may involve: ? Injecting a small amount of the possible allergen between layers of your skin (intradermal injection). ? Applying patches to your skin.  Blood tests.  How is this treated? Emergency treatment may include:  Medicines that help: ? To relieve itching and hives (antihistamines). ? To reduce swelling (corticosteroids). ? To tighten your blood vessels and increase your heart rate (epinephrine).  Oxygen therapy to help you breathe.  Giving fluids through an IV tube.  Your health care provider may teach you how to use an anaphylaxis kit and how to give yourself an epinephrine injection with what is commonly called an auto-injector "pen" (pre-filled automatic epinephrine injection device). If you think that you are having an anaphylactic reaction, you should use an auto-injector pen or an anaphylaxis kit. If you use epinephrine, you must still seek emergency medical treatment. Follow these instructions at home: Safety  Always keep an auto-injector pen or an anaphylaxis kit near you. These can be lifesaving if you have a severe anaphylactic reaction. Use your auto-injector pen or anaphylaxis kit as told by your health care provider.  Do not drive until your health care provider approves.  Make sure that you, the members of your household, and your employer know: ?  How to use an anaphylaxis kit. ? How to use an auto-injector pen to give you an epinephrine injection.  Replace your epinephrine  immediately after you use your auto-injector pen, in case you have another reaction.  Wear a medical alert bracelet or necklace that states your allergy, if told by your health care provider.  Learn the signs of anaphylaxis.  Work with your health care providers to come up with an anaphylaxis plan. Preparation is important. General instructions  Take over-the-counter and prescription medicines only as told by your health care provider.  If you have hives or a rash: ? Use an over-the-counter antihistamine as told by your health care provider. ? Apply cold, wet cloths (cold compresses) to your skin or take baths or showers in cool water. Avoid hot water.  If you had tests done, it is your responsibility to get your test results. Ask your health care provider or the department performing the tests when your results will be ready.  Tell any health care providers who care for you that you have an allergy.  Keep all follow-up visits as told by your health care provider. This is important. How is this prevented?  Avoid allergens that have caused an anaphylactic reaction for you before.  When you are at a restaurant, tell your server that you have an allergy. If you are unsure whether a meal has an ingredient that you are allergic to, ask your server. Contact a health care provider if:  You develop symptoms of an allergic reaction. You may notice them soon after you are exposed to a substance. The symptoms may include: ? Rash. ? Headache. ? Sneezing or a runny nose. ? Swelling. ? Nausea. ? Diarrhea. Get help right away if:  You needed to use epinephrine. ? An epinephrine injection helps to manage life-threatening allergic reactions, but you still need to go to the emergency room even if epinephrine seems to work. This is important because anaphylaxis may happen again within 72 hours (rebound anaphylaxis). ? If you used epinephrine to treat anaphylaxis outside of the hospital, you need  additional medical care. This may include more doses of epinephrine.  You develop: ? A tight feeling in your chest or throat. ? Wheezing or difficulty breathing. ? Hives. ? Red skin or itching all over your body. ? Swelling in your lips, tongue, or the back of your throat.  You have severe vomiting or diarrhea.  You faint or you feel like you are going to faint. These symptoms may represent a serious problem that is an emergency. Do not wait to see if the symptoms will go away. Use your auto-injector pen or anaphylaxis kit as you have been instructed, and get medical help right away. Call your local emergency services (911 in the [place] Do not drive yourself to the hospital. This information is not intended to replace advice given to you by your health care provider. Make sure you discuss any questions you have with your health care provider. Document Released: 11/22/2005 Document Revised: 07/20/2016 Document Reviewed: 06/07/2016 Elsevier Interactive Patient Education  [employer].

## 2018-07-03 NOTE — Progress Notes (Signed)
HPI:                                                                Tajae Maiolo is a 32 y.o. female who presents to [facility] today for bilateral ear itching  Reports 3 weeks of bilateral ear itching. States she was having skin flushing and feeling like "heat was coming out of my ears." she tried using topical Benadryl on a Qtip in her ear, which relieved symptoms temporarily. Reports recent travel to [place] one month ago. Reports there is aural fullness. Denies otalgia or otorrhea. Feels like she is running a low-grade fever (no documented temp).   States she is having joint swelling and pain when eating certain foods. This recently occurred while at a wedding and eating various foods, so she is unsure of a known allergen. She has a history of severe anaphylaxis shellfish allergy and has itching with eating apples.   No flowsheet data found.    Past Medical History:  Diagnosis Date  . Abnormal Pap smear of cervix 2008   cryo  . Dysthymia 06/21/2017  . Fibromyalgia   . HGSIL on cytologic smear of cervix    s/p colposcopy, most recent pap normal  . LGSIL on Pap smear of cervix   . Multiple lipomas   . PCOS (polycystic ovarian syndrome)   . Rheumatoid arthritis ([place]) 06/21/2017  . Trichomonal cervicitis    Past Surgical History:  Procedure Laterality Date  . CHOLECYSTECTOMY    . COLPOSCOPY    . KNEE ARTHROSCOPY Left 2002  . LIPOMA EXCISION    . PARTIAL GASTRECTOMY     Social History   Tobacco Use  . Smoking status: Never Smoker  . Smokeless tobacco: Never Used  Substance Use Topics  . Alcohol use: Yes    Alcohol/week: 0.0 oz    Comment: occassional   family history includes Cancer (age of onset: 68) in her paternal aunt; Cancer (age of onset: 45) in her paternal aunt; Cancer (age of onset: 60) in her maternal aunt, maternal aunt, and mother; Cancer (age of onset: 59) in her maternal aunt; Cancer (age of onset:  52) in her maternal aunt; Diabetes in her mother; Gout in her brother; Hypertension in her father and mother; Non-Hodgkin's lymphoma in her father; Rheum arthritis in her maternal grandmother; Sarcoidosis in her father.    ROS: negative except as noted in the HPI  Medications: Current Outpatient Medications  Medication Sig Dispense Refill  . cyclobenzaprine (FLEXERIL) 5 MG tablet Take 1 tablet (5 mg total) by mouth 3 (three) times daily as needed for muscle spasms. 90 tablet 2  . Ibuprofen 200 MG CAPS Take 4 capsules (800 mg total) by mouth 3 (three) times daily. 120 each 0  . levonorgestrel (MIRENA) 20 MCG/24HR IUD 1 each by Intrauterine route once.    . Triamcinolone Acetonide (TRIAMCINOLONE 0.1 % CREAM : EUCERIN) CREA Apply 1 application topically 2 (two) times daily. (Patient not taking: Reported on 05/07/2018) 1 each 3   No current facility-administered medications for this visit.    Allergies  Allergen Reactions  . Levaquin [Levofloxacin In D5w] Shortness Of Breath  . Shellfish Allergy Shortness Of Breath  . Sulfa Antibiotics Shortness Of Breath  .  Iodine        Objective:  BP 108/74   Pulse 82   Temp 98.5 F (36.9 C) (Oral)   Wt 203 lb (92.1 kg)   SpO2 98%   BMI 30.87 kg/m  Gen:  alert, not ill-appearing, no distress, appropriate for age HEENT: head normocephalic without obvious abnormality, conjunctiva and cornea clear, external ear canals normal bilaterally, TM's pearly gray and semi-transparent, multiple dark fibers in the right canal with one sitting on the TM, otherwise no debris or edema, trachea midline Pulm: Normal work of breathing, normal phonation Neuro: alert and oriented x 3, no tremor MSK: extremities atraumatic, normal gait and station Skin: intact, no rashes on exposed skin, no jaundice, no cyanosis Psych: well-groomed, cooperative, good eye contact, euthymic mood, affect mood-congruent, speech is articulate, and thought processes clear and  goal-directed    No results found for this or any previous visit (from the past 72 hour(s)). No results found.    Assessment and Plan: 32 y.o. female with   Ear itching - Plan: Ambulatory referral to Allergy, acetic acid-hydrocortisone (VOSOL-HC) OTIC solution, DISCONTINUED: acetic acid-hydrocortisone (VOSOL-HC) OTIC solution  Shellfish allergy - Plan: Ambulatory referral to Allergy, EPINEPHrine (EPIPEN 2-PAK) 0.3 mg/0.3 mL IJ SOAJ injection, DISCONTINUED: EPINEPHrine (EPIPEN 2-PAK) 0.3 mg/0.3 mL IJ SOAJ injection  History of multiple allergies - Plan: Ambulatory referral to Allergy, EPINEPHrine (EPIPEN 2-PAK) 0.3 mg/0.3 mL IJ SOAJ injection, DISCONTINUED: EPINEPHrine (EPIPEN 2-PAK) 0.3 mg/0.3 mL IJ SOAJ injection  History of food anaphylaxis - Plan: EPINEPHrine (EPIPEN 2-PAK) 0.3 mg/0.3 mL IJ SOAJ injection, DISCONTINUED: EPINEPHrine (EPIPEN 2-PAK) 0.3 mg/0.3 mL IJ SOAJ injection  - Right ear irrigated with water and fibers were easily removed. Otherwise no evidence of acute otitis externa or abnormalities to explain itching - acetic acid-hydrocortisone 5 drops tid for 3-5 days then prn - start daily second generation antihistamine (Allegra, Zyrtec, Claritin) - Rx for Epipen today - referring to Allergy for food allergy testing due to history of anaphylaxis   Patient education and anticipatory guidance given Patient agrees with treatment plan Follow-up as needed if symptoms worsen or fail to improve  Darlyne Russian PA-C

## 2018-07-04 ENCOUNTER — Telehealth: Payer: Self-pay

## 2018-07-04 ENCOUNTER — Other Ambulatory Visit: Payer: Self-pay | Admitting: Obstetrics & Gynecology

## 2018-07-04 DIAGNOSIS — R234 Changes in skin texture: Secondary | ICD-10-CM

## 2018-07-04 MED ORDER — TRIAMCINOLONE 0.1 % CREAM:EUCERIN CREAM 1:1: 1.0000 "application " | TOPICAL_CREAM | Freq: Two times a day (BID) | CUTANEOUS | 3 refills | Status: DC

## 2018-07-04 MED ORDER — TRIAMCINOLONE 0.1 % CREAM:EUCERIN CREAM 1:1: TOPICAL_CREAM | Freq: Two times a day (BID) | CUTANEOUS | Status: DC

## 2018-07-04 MED FILL — TRIAM0.1%/EUCERIN 1:1: 60 days supply | Qty: 60 | Fill #0

## 2018-07-04 NOTE — Telephone Encounter (Signed)
PT called stating that she went to pick up an Rx that was sent in March and the pharmacy didn't have it. Rx for Triamcinolone Acetonide sent.

## 2018-07-17 MED FILL — CYCLOBENZAPRINE 5 MG TABLET: 5 | 30 days supply | Qty: 90 | Fill #2

## 2018-07-19 ENCOUNTER — Encounter: Payer: Self-pay | Admitting: Allergy & Immunology

## 2018-07-19 ENCOUNTER — Ambulatory Visit (INDEPENDENT_AMBULATORY_CARE_PROVIDER_SITE_OTHER): Payer: 59 | Admitting: Allergy & Immunology

## 2018-07-19 VITALS — BP 104/68 | HR 82 | Temp 98.3°F | Resp 16 | Ht 68.0 in | Wt 205.2 lb

## 2018-07-19 DIAGNOSIS — J301 Allergic rhinitis due to pollen: Secondary | ICD-10-CM

## 2018-07-19 DIAGNOSIS — T781XXD Other adverse food reactions, not elsewhere classified, subsequent encounter: Secondary | ICD-10-CM | POA: Diagnosis not present

## 2018-07-19 NOTE — Patient Instructions (Addendum)
1. Adverse food reaction - Testing was positive to: scallop - Avoid the above foods for now.  - If you are interested, we can introduce other seafood into your diet but I understand if you would rather avoid seafood completely.  - Testing was negative to Peanut, Soy, Wheat, Sesame, Milk, Egg, Casein, Shellfish Mix , Fish Mix, Cashew, [place], [place], [place], [place], [place] nut, [place], [place], [place], [place], [place], [place], [place], [place], [place], [place], [place], [place] and [place]  - I think that your other reactions are more likely to be sensitivities since the testing today was negative. - Training for epinephrine auto-injectors provided: EpiPen - There is a the low positive predictive value of food allergy testing and hence the high possibility of false positives. - In contrast, food allergy testing has a high negative predictive value, therefore if testing is negative we can be relatively assured that they are indeed negative.  - It is difficult to know how foods allergies will progress.  - In general, peanut, tree nut, and seafood allergies are life-long after age 25 or so.  2. Seasonal rhinitis - Testing today showed: grasses - Avoidance measures provided. - Continue with: Zyrtec (cetirizine) 10mg  tablet once daily as needed - You can use an extra dose of the antihistamine, if needed, for breakthrough symptoms.  - Consider nasal saline rinses 1-2 times daily to remove allergens from the nasal cavities as well as help with mucous clearance (this is especially helpful to do before the nasal sprays are given)  3. Return if symptoms worsen or fail to improve.    Please inform us of any Emergency Department visits, hospitalizations, or changes in symptoms. Call us before going to the ED for breathing or allergy symptoms since we might be able to fit you in for a sick visit. Feel free to contact us anytime with any questions, problems, or concerns.  It was a pleasure to meet you  today!  Websites that have reliable patient information: 1. American Academy of Asthma, Allergy, and Immunology: www.aaaai.org 2. Food Allergy Research and Education (FARE): foodallergy.org 3. Mothers of Asthmatics: http://www.asthmacommunitynetwork.org 4. American College of Allergy, Asthma, and Immunology: MonthlyElectricBill.co.uk   Make sure you are registered to vote! If you have moved or changed any of your contact information, you will need to get this updated before voting!            Some of the symptoms of food intolerance and food allergy are similar, but the differences between the two are very important. Eating a food you are intolerant to can leave you feeling miserable. However, if you have a true food allergy, your body's reaction to this food could be life-threatening.  Digestive system versus immune system A food intolerance response takes place in the digestive system. It occurs when you are unable to properly breakdown the food. This could be due to enzyme deficiencies, sensitivity to food additives or reactions to naturally occurring chemicals in foods. Often, people can eat small amounts of the food without causing problems.  A food allergic reaction involves the immune system. Your immune system controls how your body defends itself. For instance, if you have an allergy to cow's milk, your immune system identifies cow's milk as an invader or allergen. Your immune system overreacts by producing antibodies called Immunoglobulin E (IgE). These antibodies travel to cells that release chemicals, causing an allergic reaction. Each type of IgE has a specific "radar" for each type of allergen.  Unlike an intolerance to food, a food  allergy can cause a serious or even life-threatening reaction by eating a microscopic amount, touching or inhaling the food.  Symptoms of allergic reactions to foods are generally seen on the skin (hives, itchiness, swelling of the skin). Gastrointestinal  symptoms may include vomiting and diarrhea. Respiratory symptoms may accompany skin and gastrointestinal symptoms, but don't usually occur alone.  Anaphylaxis (pronounced an-a-fi-LAK-sis) is a serious allergic reaction that happens very quickly. Symptoms of anaphylaxis may include difficulty breathing, dizziness or loss of consciousness. Without immediate treatment-an injection of epinephrine (adrenalin) and expert care-anaphylaxis can be fatal.  When to See an Allergist . If you think you might have a food allergy. . If you have limited your diet based on possible food allergy. . For the best diagnosis, as well as treatment and avoidance measures for food allergy.  To the Point There is a very serious difference between being intolerant to a food and having a food allergy.    Reducing Pollen Exposure  The American Academy of Allergy, Asthma and Immunology suggests the following steps to reduce your exposure to pollen during allergy seasons.    1. Do not hang sheets or clothing out to dry; pollen may collect on these items. 2. Do not mow lawns or spend time around freshly cut grass; mowing stirs up pollen. 3. Keep windows closed at night.  Keep car windows closed while driving. 4. Minimize morning activities outdoors, a time when pollen counts are usually at their highest. 5. Stay indoors as much as possible when pollen counts or humidity is high and on windy days when pollen tends to remain in the air longer. 6. Use air conditioning when possible.  Many air conditioners have filters that trap the pollen spores. 7. Use a HEPA room air filter to remove pollen form the indoor air you breathe.    You can buy saline nose drops at a pharmacy, or you can make your own saline solution:  1. Add 1 cup (240 mL) distilled water to a clean container. If you use tap water, boil it first to sterilize it, and then let it cool until it is lukewarm.  2. Add 0.5 tsp (2.5 g) salt to the water. 3. Add 0.5  tsp (2.5 g) baking soda.

## 2018-07-19 NOTE — Progress Notes (Signed)
NEW PATIENT  Date of Service/Encounter:  07/19/18  Referring provider: Trixie Dredge, PA-C   Assessment:   Adverse food reaction (scallop) - otherwise negative to the most common food allergens  Seasonal allergic rhinitis due to pollen (grass)  History of autoimmunity (positive RF) - followed by Dr. Amil Amen   Ms. Lohmeyer is a delightful 32 year old female with a history of possible rheumatoid arthritis, whose diagnosis is currently being evaluated, presenting with concern for food allergies.  Her concern is more related to food sensitivities.  However, she did have an anaphylactic reaction to some type of seafood, likely scallops.  This does bear out with testing today, as scallops is the only positive on the food panel.  She also reacted to green apples in the past, but tolerates red apples.  All apples have the same major allergenic protein, therefore this is not consistent with an IgE mediated reaction.  It does sound somewhat consistent with oral allergy syndrome, but she had no aeroallergens sensitization which would be consistent with this (birch pollen is the protein that cross-react with apple, and birch was negative on testing today).  I think she would be able to tolerate other seafood without a problem, as long as she is careful with cross-contamination.  However, she is not interested in eating any seafood at this point.  We did have a long discussion about allergies versus intolerance, and she is open to the idea that she is experiencing intolerances only.  There is no good way to diagnose intolerances aside from given the food and watching for any objective findings.  She is fine continuing with her elimination diet.  There does not seem to be a concern for any kind of nutritional deficiency with her avoidance of egg and milk.  It seems that she gets protein and multiple other ways.  However, she does need an EpiPen for the reaction to seafood.  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  Her environmental allergy testing is positive only to grass.  Her symptoms are controlled with the occasional use of cetirizine.  I do not think intradermal testing would add much to our management, as her symptoms are not that severe.  She is very comfortable with this plan.   Plan/Recommendations:    1. Adverse food reaction - Testing was positive to: scallop - Avoid the above foods for now.  - If you are interested, we can introduce other seafood into your diet but I understand if you would rather avoid seafood completely.  - Testing was negative to Peanut, Soy, Wheat, Sesame, Milk, Egg, Casein, Shellfish Mix , Fish Mix, Cashew, [place], [place], [place], [place], [place] nut, [place], [place], [place], [place], [place], [place], [place], [place], [place], [place], [place], [place] and [place]  - I think that your other reactions are more likely to be sensitivities since the testing today was negative. - Training for epinephrine auto-injectors provided: EpiPen - There is a the low positive predictive value of food allergy testing and hence the high possibility of false positives. - In contrast, food allergy testing has a high negative predictive value, therefore if testing is negative we can be relatively assured that they are indeed negative.  - It is difficult to know how foods allergies will progress.  - In general, peanut, tree nut, and seafood allergies are life-long after age 72 or so.  2. Seasonal rhinitis - Testing today showed: grasses -  Avoidance measures provided. - Continue with: Zyrtec (cetirizine) 10mg  tablet once daily as needed - You can use an extra dose of the antihistamine, if needed, for breakthrough symptoms.  - Consider nasal saline rinses 1-2 times daily to remove allergens from the nasal cavities as well as help with mucous clearance (this is especially helpful to do before the nasal sprays are  given)  3. Return if symptoms worsen or fail to improve.   Subjective:   Brisia Schuermann is a 32 y.o. female presenting today for evaluation of  Chief Complaint  Patient presents with  . Food Intolerance    joint pain and swelling with certain foods.  Allergic to shellfish.    Shadiyah Montelongo has a history of the following: Patient Active Problem List   Diagnosis Date Noted  . Shellfish allergy 07/03/2018  . History of multiple allergies 07/03/2018  . History of food anaphylaxis 07/03/2018  . Ear itching 07/03/2018  . Depression/fibromyalgia/chronic fatigue syndrome 12/16/2017  . Overweight (BMI 25.0-29.9) 11/24/2017  . Polyarthralgia 11/24/2017  . IUD (intrauterine device) in place 06/21/2017  . Family history of breast cancer gene mutation in first degree relative 06/21/2017  . Family history of cancer 12/08/2015    History obtained from: chart review and patient.  Dilia Walthers was she referred by Trixie Dredge, PA-C.     Aubree is a 32 y.o. female presenting for an evaluation of possible food allergies.  She reports that she has had ongoing inflammation for a number of years.  She did bring this up to her primary care provider, who got a rheumatoid factor panel that was slightly positive to rheumatoid factor IgG, IgA, and IgM.  She also had elevated inflammatory markers.  Because of this, she was referred to rheumatology.  She saw Dr. Amil Amen.  Evidently, in the past she has been diagnosed with fibromyalgia.  It does not seem at this point that she has a distinct diagnosis.  In any case, as part of this work-up, she has been working on changing her diet to an anti-inflammatory diet.  As part of this, she has been decreasing her cows milk intake as well as her egg intake.  She is wondering whether there are other foods that are contributing to her inflammation.  She has felt much better since making the small changes. She is getting her antiinflammatory diet through  trial and error. She is trying to increase her vegetables and fruit intake.   Review of her symptoms shows that she has had what sounds like an IgE mediated reaction when she was much younger.  She developed throat tightening when exposed to scallops.  Then she had scallops again shortly thereafter and developed eye swelling with throat tightening.  She was never treated with epinephrine and improved instead on Benadryl only.  She has avoided all seafood since that time due to her risk of cross-contamination.  She is not interested in introducing seafood at all into her diet.    She also had a reaction to apple when she was very young.  Interestingly, it was only green apple.  She reports that she had oral itching and a perioral rash.  She tolerates other fruits and vegetables without any problem.  She eats wheat without any problems.  Although she is avoiding cows milk and eggs at this time, she tolerates them without any evidence of anaphylaxis.  She does find with sesame as well as soy.  She eats meat, but focuses mainly on chicken.  She  does have an EpiPen for her seafood reaction.  She recently went to a wedding and was not compliant with her diet. At the wedding, she had fried crispy "things" that appeared like an egg roll. There was no shellfish to her knowledge. The other items were chicken, mashed potatoes, and green beans. These are all things that she tolerates without a problem.   Allergic Rhinitis Symptom History: She does report some allergic rhinitis symptoms. She was living in [place] before this and her seasonal allergy symptoms have ramped up since being here. She "deals" with her seasonal allergies. She does take antihistamines if she is really bad.  She has never been on a nasal spray.  She does get sinus infections 1-2 times per year.   She has no history of asthma and denies coughing. Otherwise, there is no history of other atopic diseases, including asthma, drug allergies, stinging  insect allergies, or urticaria. There is no significant infectious history. Vaccinations are up to date.    Past Medical History: Patient Active Problem List   Diagnosis Date Noted  . Shellfish allergy 07/03/2018  . History of multiple allergies 07/03/2018  . History of food anaphylaxis 07/03/2018  . Ear itching 07/03/2018  . Depression/fibromyalgia/chronic fatigue syndrome 12/16/2017  . Overweight (BMI 25.0-29.9) 11/24/2017  . Polyarthralgia 11/24/2017  . IUD (intrauterine device) in place 06/21/2017  . Family history of breast cancer gene mutation in first degree relative 06/21/2017  . Family history of cancer 12/08/2015    Medication List:  Allergies as of 07/19/2018      Reactions   Iodine Anaphylaxis   Levaquin [levofloxacin In D5w] Itching   Headaches, vomiting   Shellfish Allergy Anaphylaxis   Sulfa Antibiotics Itching   Migraine, vomiting   Apple Itching      Medication List        Accurate as of 07/19/18  1:21 PM. Always use your most recent med list.          cyclobenzaprine 5 MG tablet Commonly known as:  FLEXERIL Take 1 tablet (5 mg total) by mouth 3 (three) times daily as needed for muscle spasms.   EPINEPHrine 0.3 mg/0.3 mL Soaj injection Commonly known as:  EPI-PEN Inject 0.3 mLs (0.3 mg total) into the muscle once as needed for up to 1 dose.   Ibuprofen 200 MG Caps Take 4 capsules (800 mg total) by mouth 3 (three) times daily.   levonorgestrel 20 MCG/24HR IUD Commonly known as:  MIRENA 1 each by Intrauterine route once.       Birth History: non-contributory.   Developmental History: non-contributory.   Past Surgical History: Past Surgical History:  Procedure Laterality Date  . CHOLECYSTECTOMY    . COLPOSCOPY    . KNEE ARTHROSCOPY Left 2002  . LIPOMA EXCISION    . PARTIAL GASTRECTOMY       Family History: Family History  Problem Relation Age of Onset  . Hypertension Mother   . Diabetes Mother   . Cancer Mother 32       breast    . Allergic rhinitis Mother   . Sarcoidosis Father   . Non-Hodgkin's lymphoma Father   . Hypertension Father   . Allergic rhinitis Father   . Food Allergy Father        shellfish allergy and iodine/contrast dye allergy  . Cancer Paternal Aunt 69       breast   . Allergic rhinitis Paternal Aunt   . Cancer Paternal Aunt 47  ovarian  . Cancer Maternal Aunt 48       breast  . Allergic rhinitis Maternal Aunt   . Cancer Maternal Aunt 50       breast and ovarian  . Cancer Maternal Aunt 49       breast  . Cancer Maternal Aunt 48       breast  . Gout Brother   . Allergic rhinitis Brother   . Rheum arthritis Maternal Grandmother   . Allergic rhinitis Sister   . Angioedema Neg Hx   . Asthma Neg Hx   . Eczema Neg Hx   . Immunodeficiency Neg Hx   . Urticaria Neg Hx      Social History: Trudy lives at home with her husband and 31yo son.  They live in a house that was built in 2007.  There is carpeting throughout the home.  Her husband is a Scientist, research (medical).  They have electric heating and central cooling.  There is a dog in the home, who is not new.  She does have dust mite covers on her bed, but not her pillows.  There is no tobacco exposure.  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    Review of Systems: a 14-point review of systems is pertinent for what is mentioned in HPI.  Otherwise, all other systems were negative. Constitutional: negative other than that listed in the HPI Eyes: negative other than that listed in the HPI Ears, nose, mouth, throat, and face: negative other than that listed in the HPI Respiratory: negative other than that listed in the HPI Cardiovascular: negative other than that listed in the HPI Gastrointestinal: negative other than that listed in the HPI Genitourinary: negative other than that listed in the HPI Integument: negative other than that listed in the HPI Hematologic: negative other than that listed in the  HPI Musculoskeletal: negative other than that listed in the HPI Neurological: negative other than that listed in the HPI Allergy/Immunologic: negative other than that listed in the HPI    Objective:   Blood pressure 104/68, pulse 82, temperature 98.3 F (36.8 C), temperature source Oral, resp. rate 16, height 5\' 8"  (1.727 m), weight 205 lb 3.2 oz (93.1 kg), SpO2 98 %. Body mass index is 31.2 kg/m.   Physical Exam:  General: Alert, interactive, in no acute distress. Smiling and pleasnat.  Eyes: No conjunctival injection bilaterally, no discharge on the right, no discharge on the left and no Horner-Trantas dots present. PERRL bilaterally. EOMI without pain. No photophobia.  Ears: Right TM pearly gray with normal light reflex, Left TM pearly gray with normal light reflex, Right TM intact without perforation and Left TM intact without perforation.  Nose/Throat: External nose within normal limits and septum midline. Turbinates edematous with clear discharge. Posterior oropharynx erythematous without cobblestoning in the posterior oropharynx. Tonsils 2+ without exudates.  Tongue without thrush. Neck: Supple without thyromegaly. Trachea midline. Adenopathy: no enlarged lymph nodes appreciated in the anterior cervical, occipital, axillary, epitrochlear, inguinal, or popliteal regions. Lungs: Clear to auscultation without wheezing, rhonchi or rales. No increased work of breathing. CV: Normal S1/S2. No murmurs. Capillary refill <2 seconds.  Abdomen: Nondistended, nontender. No guarding or rebound tenderness. Bowel sounds present in all fields and hypoactive  Skin: Warm and dry, without lesions or rashes. Extremities:  No clubbing, cyanosis or edema. Neuro:   Grossly intact. No focal deficits appreciated. Responsive to questions.  Diagnostic studies:    Allergy Studies:  Indoor/Outdoor Percutaneous Adult Environmental Panel: positive to [place] grass, perennial rye grass and timothy grass.  Otherwise negative with adequate controls.  Selected Food Panel: positive to Scallop (5x9) with adequate controls. Negative to Peanut, Soy, Wheat, Sesame, Milk, Egg, Casein, Shellfish Mix , Fish Mix, Cashew, [place], [place], [place], [place], [place] nut, [place], [place], [place], [place], [place], [place], [place], [place], [place], [place], [place], [place] and [place]    Allergy testing results were read and interpreted by myself, documented by clinical staff.       Salvatore Marvel, MD [facility]

## 2018-08-09 DIAGNOSIS — Z6831 Body mass index (BMI) 31.0-31.9, adult: Secondary | ICD-10-CM | POA: Diagnosis not present

## 2018-08-09 DIAGNOSIS — E663 Overweight: Secondary | ICD-10-CM | POA: Diagnosis not present

## 2018-08-09 DIAGNOSIS — R768 Other specified abnormal immunological findings in serum: Secondary | ICD-10-CM | POA: Diagnosis not present

## 2018-08-09 DIAGNOSIS — M2550 Pain in unspecified joint: Secondary | ICD-10-CM | POA: Diagnosis not present

## 2018-08-22 MED FILL — TRIAM0.1%/EUCERIN 1:1: 60 days supply | Qty: 60 | Fill #1

## 2018-09-12 DIAGNOSIS — H5213 Myopia, bilateral: Secondary | ICD-10-CM | POA: Diagnosis not present

## 2018-09-12 DIAGNOSIS — H52223 Regular astigmatism, bilateral: Secondary | ICD-10-CM | POA: Diagnosis not present

## 2018-09-26 DIAGNOSIS — H52223 Regular astigmatism, bilateral: Secondary | ICD-10-CM | POA: Diagnosis not present

## 2018-09-26 DIAGNOSIS — H5213 Myopia, bilateral: Secondary | ICD-10-CM | POA: Diagnosis not present

## 2018-10-09 ENCOUNTER — Ambulatory Visit (INDEPENDENT_AMBULATORY_CARE_PROVIDER_SITE_OTHER): Payer: 59

## 2018-10-09 ENCOUNTER — Ambulatory Visit (INDEPENDENT_AMBULATORY_CARE_PROVIDER_SITE_OTHER): Payer: 59 | Admitting: Physician Assistant

## 2018-10-09 ENCOUNTER — Encounter: Payer: Self-pay | Admitting: Physician Assistant

## 2018-10-09 VITALS — BP 129/80 | HR 90 | Wt 202.0 lb

## 2018-10-09 DIAGNOSIS — M797 Fibromyalgia: Secondary | ICD-10-CM | POA: Insufficient documentation

## 2018-10-09 DIAGNOSIS — G9332 Myalgic encephalomyelitis/chronic fatigue syndrome: Secondary | ICD-10-CM

## 2018-10-09 DIAGNOSIS — R768 Other specified abnormal immunological findings in serum: Secondary | ICD-10-CM | POA: Diagnosis not present

## 2018-10-09 DIAGNOSIS — R5382 Chronic fatigue, unspecified: Secondary | ICD-10-CM

## 2018-10-09 DIAGNOSIS — N926 Irregular menstruation, unspecified: Secondary | ICD-10-CM

## 2018-10-09 DIAGNOSIS — Z3201 Encounter for pregnancy test, result positive: Secondary | ICD-10-CM

## 2018-10-09 DIAGNOSIS — M2550 Pain in unspecified joint: Secondary | ICD-10-CM | POA: Diagnosis not present

## 2018-10-09 DIAGNOSIS — Z3200 Encounter for pregnancy test, result unknown: Secondary | ICD-10-CM

## 2018-10-09 LAB — POCT URINE PREGNANCY: Preg Test, Ur: POSITIVE — AB

## 2018-10-09 MED ORDER — CYCLOBENZAPRINE HCL 5 MG PO TABS: 5.0000 mg | ORAL_TABLET | Freq: Three times a day (TID) | ORAL | 2 refills | Status: DC | PRN

## 2018-10-09 MED FILL — CYCLOBENZAPRINE 5 MG TABLET: 5 | 20 days supply | Qty: 60 | Fill #0

## 2018-10-09 NOTE — Progress Notes (Signed)
Pt here for UPT. She has had a missed period. Test was positive. PT is scheduling new OB appt.

## 2018-10-09 NOTE — Progress Notes (Signed)
HPI:                                                                Kaylee Anderson is a 32 y.o. female who presents to [place]: [place] today for fibromyalgia follow-up  She was evaluated by Dr. Amil Amen, Rheumatology on 04/11/18, who felt her RF was mildly positive and favored conservative management of fibromyalgia with lifestyle modification. She is currently managing with elimination diet/avoidance of dairy/grains, chiropractor and acupuncture.  Taking Flexeril and Ibuprofen prn. No longer taking Cymbalta. Next appt with Dr. Amil Amen is March 2020.  She is requesting intermittent FMLA for fibromyalgia flare-ups. 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   Depression screen [facility] 2/9 10/09/2018 11/24/2017 07/19/2017 06/21/2017  Decreased Interest 0 0 0 1  Down, Depressed, Hopeless 0 0 0 1  PHQ - 2 Score 0 0 0 2  Altered sleeping 1 - 1 -  Tired, decreased energy 1 - 1 -  Change in appetite 0 - 0 -  Feeling bad or failure about yourself  0 - 0 -  Trouble concentrating 1 - 0 -  Moving slowly or fidgety/restless 0 - 0 -  Suicidal thoughts 0 - 0 -  PHQ-9 Score 3 - 2 -    GAD 7 : Generalized Anxiety Score 10/09/2018  Nervous, Anxious, on Edge 0  Control/stop worrying 0  Worry too much - different things 1  Trouble relaxing 1  Restless 0  Easily annoyed or irritable 0  Afraid - awful might happen 0  Total GAD 7 Score 2      Past Medical History:  Diagnosis Date  . Abnormal Pap smear of cervix 2008   cryo  . Dysthymia 06/21/2017  . Fibromyalgia   . HGSIL on cytologic smear of cervix    s/p colposcopy, most recent pap normal  . LGSIL on Pap smear of cervix   . Multiple lipomas   . PCOS (polycystic ovarian syndrome)   . Rheumatoid arthritis ([place]) 06/21/2017  . Trichomonal cervicitis    Past Surgical History:  Procedure Laterality Date  .  CHOLECYSTECTOMY    . COLPOSCOPY    . KNEE ARTHROSCOPY Left 2002  . LIPOMA EXCISION    . PARTIAL GASTRECTOMY     Social History   Tobacco Use  . Smoking status: Never Smoker  . Smokeless tobacco: Never Used  Substance Use Topics  . Alcohol use: Not Currently    Alcohol/week: 0.0 standard drinks    Comment: occassional   family history includes Allergic rhinitis in her brother, father, maternal aunt, mother, paternal aunt, and sister; Cancer (age of onset: 16) in her paternal aunt; Cancer (age of onset: 51) in her paternal aunt; Cancer (age of onset: 76) in her maternal aunt, maternal aunt, and mother; Cancer (age of onset: 81) in her maternal aunt; Cancer (age of onset: 83) in her maternal aunt; Diabetes in her mother; Food Allergy in her father; Gout in her brother; Hypertension in her father and mother; Non-Hodgkin's lymphoma in her father; Rheum arthritis in her maternal grandmother; Sarcoidosis in her father.    ROS: negative except as noted in the HPI  Medications: Current Outpatient Medications  Medication Sig Dispense Refill  . cyclobenzaprine (FLEXERIL) 5 MG tablet Take 1 tablet (5 mg total) by mouth 3 (three) times daily as needed for muscle spasms. 60 tablet 2  . EPINEPHrine (EPIPEN 2-PAK) 0.3 mg/0.3 mL IJ SOAJ injection Inject 0.3 mLs (0.3 mg total) into the muscle once as needed for up to 1 dose. 2 Device 1  . Ibuprofen 200 MG CAPS Take 4 capsules (800 mg total) by mouth 3 (three) times daily. 120 each 0   Current Facility-Administered Medications  Medication Dose Route Frequency Provider Last Rate Last Dose  . triamcinolone 0.1 % cream : eucerin cream, 1:1   Topical BID Guss Bunde, MD       Allergies  Allergen Reactions  . Iodine Anaphylaxis  . Levaquin [Levofloxacin In D5w] Itching    Headaches, vomiting  . Sulfa Antibiotics Itching    Migraine, vomiting  . Shellfish Allergy Swelling  . Apple Itching       Objective:  BP 129/80   Pulse 90   Wt 202  lb (91.6 kg)   BMI 30.71 kg/m  Gen:  alert, not ill-appearing, no distress, appropriate for age, obese female HEENT: head normocephalic without obvious abnormality, conjunctiva and cornea clear, wearing glasses, trachea midline Pulm: Normal work of breathing, normal phonation, clear to auscultation bilaterally, no wheezes, rales or rhonchi CV: Normal rate, regular rhythm, s1 and s2 distinct, no murmurs, clicks or rubs  Neuro: alert and oriented x 3, no tremor MSK: extremities atraumatic, normal gait and station Skin: intact, no rashes on exposed skin, no jaundice, no cyanosis Psych: well-groomed, cooperative, good eye contact, euthymic mood, affect mood-congruent, speech is articulate, and thought processes clear and goal-directed    No results found for this or any previous visit (from the past 72 hour(s)). No results found.    Assessment and Plan: 32 y.o. female with   .Diagnoses and all orders for this visit:  Polyarthralgia -     cyclobenzaprine (FLEXERIL) 5 MG tablet; Take 1 tablet (5 mg total) by mouth 3 (three) times daily as needed for muscle spasms.  Chronic fatigue disorder  Rheumatoid factor positive   Intermittent FMLA forms completed in office today and faxed to employer Patient encouraged to keep f/u appt with Dr. Amil Amen and schedule an appointment sooner if her symptoms persist or worsen Refill of Flexeril prn for muscle spasm/ache. We discussed option to switch to a less sedating muscle relaxer, but she prefers to use this to assist with sleep  Patient education and anticipatory guidance given Patient agrees with treatment plan Follow-up as needed if symptoms worsen or fail to improve  Darlyne Russian PA-C

## 2018-10-19 ENCOUNTER — Ambulatory Visit: Payer: Self-pay | Admitting: Family Medicine

## 2018-10-19 VITALS — BP 122/82 | HR 72 | Temp 98.7°F | Wt 202.6 lb

## 2018-10-19 DIAGNOSIS — M545 Low back pain, unspecified: Secondary | ICD-10-CM

## 2018-10-19 LAB — POCT URINALYSIS DIPSTICK
Bilirubin, UA: NEGATIVE
Blood, UA: NEGATIVE
Glucose, UA: NEGATIVE
KETONES UA: NEGATIVE
Leukocytes, UA: NEGATIVE
Nitrite, UA: NEGATIVE
PH UA: 6.5 (ref 5.0–8.0)
Protein, UA: NEGATIVE
Spec Grav, UA: 1.015 (ref 1.010–1.025)
UROBILINOGEN UA: 0.2 U/dL

## 2018-10-19 NOTE — Progress Notes (Signed)
Kaylee Anderson is a 32 y.o. female who presents today with concerns of low back pain and inability to completely empty urine. She reports known history of a miscarriage last week. She is under the care of GYN at this time. She has not contacted the office about these symptoms since they began.  Review of Systems  Constitutional: Negative for chills, fever and malaise/fatigue.  HENT: Negative for congestion, ear discharge, ear pain, sinus pain and sore throat.   Eyes: Negative.   Respiratory: Negative for cough, sputum production and shortness of breath.   Cardiovascular: Negative.  Negative for chest pain.  Gastrointestinal: Negative for abdominal pain, diarrhea, nausea and vomiting.  Genitourinary: Positive for frequency. Negative for dysuria, hematuria and urgency.  Musculoskeletal: Negative for myalgias.  Skin: Negative.   Neurological: Negative for headaches.  Endo/Heme/Allergies: Negative.   Psychiatric/Behavioral: Negative.     O: Vitals:   10/19/18 0924  BP: 122/82  Pulse: 72  Temp: 98.7 F (37.1 C)  SpO2: 100%     Physical Exam  Constitutional: She is oriented to person, place, and time. Vital signs are normal. She appears well-developed and well-nourished. She is active.  Non-toxic appearance. She does not have a sickly appearance.  HENT:  Head: Normocephalic.  Right Ear: Hearing, tympanic membrane, external ear and ear canal normal.  Left Ear: Hearing, tympanic membrane, external ear and ear canal normal.  Nose: Nose normal.  Mouth/Throat: Uvula is midline and oropharynx is clear and moist.  Neck: Normal range of motion. Neck supple.  Cardiovascular: Normal rate, regular rhythm, normal heart sounds and normal pulses.  Pulmonary/Chest: Effort normal and breath sounds normal.  Abdominal: Soft. Bowel sounds are normal. There is no tenderness.  Patient denies any abdominal tenderness on exam but does report low back pain on the left and right.  Musculoskeletal: Normal  range of motion.  Lymphadenopathy:       Head (right side): No submental and no submandibular adenopathy present.       Head (left side): No submental and no submandibular adenopathy present.    She has no cervical adenopathy.  Neurological: She is alert and oriented to person, place, and time.  Psychiatric: She has a normal mood and affect.  Vitals reviewed.  A: 1. Acute midline low back pain without sciatica    P: Exam findings, diagnosis etiology and medication use and indications reviewed with patient. Follow- Up and discharge instructions provided. No emergent/urgent issues found on exam.  Patient verbalized understanding of information provided and agrees with plan of care (POC), all questions answered.  Patient advised to seek evaluation and treatment with GYN given the knowledge that her symptoms may benefit from a urine culture and may be related to her recent miscarriage. Exam is overall unremarkable.  1. Acute midline low back pain without sciatica - POCT Urinalysis Dipstick  Results for orders placed or performed in visit on 10/19/18 (from the past 24 hour(s))  POCT Urinalysis Dipstick     Status: Normal   Collection Time: 10/19/18  9:31 AM  Result Value Ref Range   Color, UA no    Clarity, UA clear    Glucose, UA Negative Negative   Bilirubin, UA negative    Ketones, UA negative    Spec Grav, UA 1.015 1.010 - 1.025   Blood, UA negative    pH, UA 6.5 5.0 - 8.0   Protein, UA Negative Negative   Urobilinogen, UA 0.2 0.2 or 1.0 E.U./dL   Nitrite, UA negative  Leukocytes, UA Negative Negative   Appearance dark yellow    Odor no

## 2018-10-19 NOTE — Progress Notes (Signed)
pocpoc

## 2018-10-19 NOTE — Patient Instructions (Signed)
Miscarriage  Follow up with GYN  A miscarriage is the sudden loss of an unborn baby (fetus) before the 20th week of pregnancy. Most miscarriages happen in the first 3 months of pregnancy. Sometimes, it happens before a woman even knows she is pregnant. A miscarriage is also called a "spontaneous miscarriage" or "early pregnancy loss." Having a miscarriage can be an emotional experience. Talk with your caregiver about any questions you may have about miscarrying, the grieving process, and your future pregnancy plans. What are the causes? Often, the cause of a miscarriage is unknown. What are the signs or symptoms?  Vaginal bleeding or spotting, with or without cramps or pain.  Pain or cramping in the abdomen or lower back.  Passing fluid, tissue, or blood clots from the vagina. How is this diagnosed? Your caregiver will perform a physical exam. You may also have an ultrasound to confirm the miscarriage. Blood or urine tests may also be ordered. How is this treated?  Sometimes, treatment is not necessary if you naturally pass all the fetal tissue that was in the uterus. If some of the fetus or placenta remains in the body (incomplete miscarriage), tissue left behind may become infected and must be removed. Usually, a dilation and curettage (D and C) procedure is performed. During a D and C procedure, the cervix is widened (dilated) and any remaining fetal or placental tissue is gently removed from the uterus.  Antibiotic medicines are prescribed if there is an infection. Other medicines may be given to reduce the size of the uterus (contract) if there is a lot of bleeding.  If you have Rh negative blood and your baby was Rh positive, you will need a Rh immunoglobulin shot. This shot will protect any future baby from having Rh blood problems in future pregnancies. Follow these instructions at home:  Your caregiver may order bed rest or may allow you to continue light activity. Resume activity  as directed by your caregiver.  Have someone help with home and family responsibilities during this time.  Keep track of the number of sanitary pads you use each day and how soaked (saturated) they are. Write down this information.  Do not use tampons. Do not douche or have sexual intercourse until approved by your caregiver.  Only take over-the-counter or prescription medicines for pain or discomfort as directed by your caregiver.  Do not take aspirin. Aspirin can cause bleeding.  Keep all follow-up appointments with your caregiver.  If you or your partner have problems with grieving, talk to your caregiver or seek counseling to help cope with the pregnancy loss. Allow enough time to grieve before trying to get pregnant again. Get help right away if:  You have severe cramps or pain in your back or abdomen.  You have a fever.  You pass large blood clots (walnut-sized or larger) ortissue from your vagina. Save any tissue for your caregiver to inspect.  Your bleeding increases.  You have a thick, bad-smelling vaginal discharge.  You become lightheaded, weak, or you faint.  You have chills. This information is not intended to replace advice given to you by your health care provider. Make sure you discuss any questions you have with your health care provider. Document Released: 05/18/2001 Document Revised: 04/29/2016 Document Reviewed: 01/11/2012 Elsevier Interactive Patient Education  2017 [employer].

## 2018-11-14 ENCOUNTER — Encounter: Payer: 59 | Admitting: Advanced Practice Midwife

## 2018-11-27 MED FILL — ACETAMINOPHEN/COD #3 TABLET: 300-30 | 4 days supply | Qty: 15 | Fill #0

## 2018-12-06 NOTE — L&D Delivery Note (Signed)
OB/GYN Faculty Practice Delivery Note  Kaylee Anderson is a 33 y.o. G2P1001 s/p NSVD at [redacted]w[redacted]d. She was admitted for Contractions and Active Labor.   ROM: 2h 64m with clear fluid GBS Status: Negative Maximum Maternal Temperature: 98.5  Labor Progress: . Patient presented to MAU with contractions and 6 cm dilation. She was admitted to labor and delivery, and AROM was performed. After a short second stage, she was fully dilated.  Delivery Date/Time: 08/17/19, [id number] Delivery: Called to room and patient was complete and pushing. Head delivered straight OQ. No nuchal cord present. Shoulder and body delivered in usual fashion. Infant with spontaneous cry, placed on mother's abdomen, dried and stimulated. Cord clamped x 2 after 1-minute delay, and cut by FOB under my direct supervision. Cord blood drawn. Placenta delivered spontaneously with gentle cord traction at 0532 hours via Delena Bali mechanism. Fundus firm with massage and Pitocin. Labia, perineum, vagina, and cervix were inspected, and a small first degree was repaired with 4-0 vicryl in the usual fashion. She also had bilateral labial tears: the left was hemostatic, and the right was repaired with 4-0 vicryl in the usual fashion. The vagina was re-inspected and excellent hemostasis was noted.   Placenta: intact Complications: None Lacerations: Bilateral labial, small 1st degree EBL: 157mL Analgesia: 1% lidocaine  Postpartum Planning [x]  message to sent to schedule follow-up  [x]  vaccines UTD  Infant: Female  APGARs 8&9  weight per medical record  Merilyn Baba, DO OB/GYN Fellow, Faculty Practice

## 2019-01-03 ENCOUNTER — Telehealth: Payer: 59 | Admitting: Family

## 2019-01-03 DIAGNOSIS — Z3490 Encounter for supervision of normal pregnancy, unspecified, unspecified trimester: Secondary | ICD-10-CM

## 2019-01-03 DIAGNOSIS — R110 Nausea: Secondary | ICD-10-CM

## 2019-01-03 MED ORDER — VITAMIN B-6 25 MG PO TABS: 25.0000 mg | ORAL_TABLET | Freq: Three times a day (TID) | ORAL | 2 refills | Status: DC | PRN

## 2019-01-03 NOTE — Progress Notes (Signed)
Spent 8 mins charting and reviewing chart.

## 2019-01-03 NOTE — Progress Notes (Signed)
We are sorry that you are not feeling well. Here is how we plan to help!  Based on what you have shared with me it looks like you have a nausea from pregnancy. I recommend you start taking pyridoxine( vitamin B6)  25 mg orally evey eight hours as needed for nausea. I also recommend to call your GYN and let them know how you are feeling. Keep all your follow up appointments with them.   I have prescribed a medication that will help alleviate your symptoms and allow you to stay hydrated:  HOME CARE:  Drink clear liquids.  This is very important! Dehydration (the lack of fluid) can lead to a serious complication.  Start off with 1 tablespoon every 5 minutes for 8 hours.  You may begin eating bland foods after 8 hours without vomiting.  Start with saltine crackers, white bread, rice, mashed potatoes, applesauce.  After 48 hours on a bland diet, you may resume a normal diet.  Try to go to sleep.  Sleep often empties the stomach and relieves the need to vomit.  GET HELP RIGHT AWAY IF:   Your symptoms do not improve or worsen within 2 days after treatment.  You have a fever for over 3 days.  You cannot keep down fluids after trying the medication.  MAKE SURE YOU:   Understand these instructions.  Will watch your condition.  Will get help right away if you are not doing well or get worse.   Thank you for choosing an e-visit. Your e-visit answers were reviewed by a board certified advanced clinical practitioner to complete your personal care plan. Depending upon the condition, your plan could have included both over the counter or prescription medications. Please review your pharmacy choice. Be sure that the pharmacy you have chosen is open so that you can pick up your prescription now.  If there is a problem you may message your provider in [place] to have the prescription routed to another pharmacy. Your safety is important to [place]. If you have drug allergies check your prescription  carefully.  For the next 24 hours, you can use MyChart to ask questions about today's visit, request a non-urgent call back, or ask for a work or school excuse from your e-visit provider. You will get an e-mail in the next two days asking about your experience. I hope that your e-visit has been valuable and will speed your recovery.

## 2019-01-04 ENCOUNTER — Emergency Department
Admission: EM | Admit: 2019-01-04 | Discharge: 2019-01-04 | Disposition: A | Discharge status: Home / Self Care | Payer: 59 | Source: Home / Self Care

## 2019-01-04 ENCOUNTER — Other Ambulatory Visit: Payer: Self-pay

## 2019-01-04 DIAGNOSIS — O219 Vomiting of pregnancy, unspecified: Secondary | ICD-10-CM | POA: Diagnosis not present

## 2019-01-04 MED ORDER — DOXYLAMINE-PYRIDOXINE 10-10 MG PO TBEC: DELAYED_RELEASE_TABLET | ORAL | 0 refills | Status: DC

## 2019-01-04 MED ORDER — ONDANSETRON 4 MG PO TBDP
4.0000 mg | ORAL_TABLET | Freq: Once | ORAL | Status: AC
  Administered 2019-01-04: 4 mg via ORAL

## 2019-01-04 MED FILL — DOXYLAMINE-PYRIDOXINE 10-10: 10-10 | 15 days supply | Qty: 60 | Fill #0

## 2019-01-04 NOTE — Discharge Instructions (Signed)
°  Please try the medication prescribed today for nausea and vomiting related to early pregnancy. Be sure to stay well hydrated with slushees, popsicles, ice chips, broth, or small sips of Gatorade.    If symptoms not improving, call your OB/GYN to schedule an appointment to discuss better management of nausea and vomiting early in pregnancy. This will not be the same type of visit as your first prenatal appointment, scheduled for February 10th.    If symptoms worsen significantly this evening or over the weekend, please call 911 or go to [facility] for further evaluation and treatment including possible IV fluids.

## 2019-01-04 NOTE — ED Triage Notes (Signed)
Pt came into clinic today for nausea and vomiting. States her LMP was around 11/10/18, positive pregnancy test a couple days ago. Having a hard time keeping any liquids down. Has been taking Vit B6 25mg  QID, with no relief.

## 2019-01-04 NOTE — ED Provider Notes (Signed)
[facility]    CSN: [id number] Arrival date & time: 01/04/19  0954     History   Chief Complaint Chief Complaint  Patient presents with  . Morning Sickness    HPI Kaylee Anderson is a 33 y.o. female.   HPI Kaylee Anderson is a 33 y.o. female presenting to UC with c/o nausea and vomiting for the last 1 week she believes due to pregnancy. Pt had a positive home pregnancy test on 11/10/18.  Pt has her first prenatal visit OB/GYN scheduled 01/15/2019.  She has tried Vitamin B6 25mg  QID since yesterday w/o relief. Two episodes of vomiting today but about 5-10 episodes yesterday. No diarrhea. No fever. Denies abdominal pain, dysuria, or vaginal symptoms. Hx of similar symptoms when she was pregnant with her son.   Past Medical History:  Diagnosis Date  . Abnormal Pap smear of cervix 2008   cryo  . Dysthymia 06/21/2017  . Fibromyalgia   . HGSIL on cytologic smear of cervix    s/p colposcopy, most recent pap normal  . LGSIL on Pap smear of cervix   . Multiple lipomas   . PCOS (polycystic ovarian syndrome)   . Rheumatoid arthritis ([place]) 06/21/2017  . Trichomonal cervicitis     Patient Active Problem List   Diagnosis Date Noted  . Chronic fatigue disorder 10/09/2018  . Rheumatoid factor positive 10/09/2018  . Shellfish allergy 07/03/2018  . History of multiple allergies 07/03/2018  . History of food anaphylaxis 07/03/2018  . Ear itching 07/03/2018  . Depression/fibromyalgia/chronic fatigue syndrome 12/16/2017  . Overweight (BMI 25.0-29.9) 11/24/2017  . Polyarthralgia 11/24/2017  . IUD (intrauterine device) in place 06/21/2017  . Family history of breast cancer gene mutation in first degree relative 06/21/2017  . Family history of cancer 12/08/2015    Past Surgical History:  Procedure Laterality Date  . CHOLECYSTECTOMY    . COLPOSCOPY    . KNEE ARTHROSCOPY Left 2002  . LIPOMA EXCISION    . PARTIAL GASTRECTOMY      OB History    Gravida  2   Para  1   Term    1   Preterm      AB      Living  1     SAB      TAB      Ectopic      Multiple      Live Births               Home Medications    Prior to Admission medications   Medication Sig Start Date End Date Taking? Authorizing Provider  EPINEPHrine (EPIPEN 2-PAK) 0.3 mg/0.3 mL IJ SOAJ injection Inject 0.3 mLs (0.3 mg total) into the muscle once as needed for up to 1 dose. 07/03/18  Yes Trixie Dredge, PA-C  vitamin B-6 (PYRIDOXINE) 25 MG tablet Take 1 tablet (25 mg total) by mouth every 8 (eight) hours as needed. 01/03/19  Yes Hawks, Christy A, FNP  cyclobenzaprine (FLEXERIL) 5 MG tablet Take 1 tablet (5 mg total) by mouth 3 (three) times daily as needed for muscle spasms. 10/09/18   Trixie Dredge, PA-C  Doxylamine-Pyridoxine 10-10 MG TBEC Day 1 & 2: Two tabs at bedtime, Day 3: one tab AM and two tabs at bedtime, Day 4: one tab AM, one tab mid-afternoon, two tabs bedtime 01/04/19   Noe Gens, PA-C  Ibuprofen 200 MG CAPS Take 4 capsules (800 mg total) by mouth 3 (three) times daily. 12/16/17  Silverio Decamp, MD    Family History Family History  Problem Relation Age of Onset  . Hypertension Mother   . Diabetes Mother   . Cancer Mother 58       breast  . Allergic rhinitis Mother   . Sarcoidosis Father   . Non-Hodgkin's lymphoma Father   . Hypertension Father   . Allergic rhinitis Father   . Food Allergy Father        shellfish allergy and iodine/contrast dye allergy  . Cancer Paternal Aunt 40       breast   . Allergic rhinitis Paternal Aunt   . Cancer Paternal Aunt 36       ovarian  . Cancer Maternal Aunt 48       breast  . Allergic rhinitis Maternal Aunt   . Cancer Maternal Aunt 50       breast and ovarian  . Cancer Maternal Aunt 49       breast  . Cancer Maternal Aunt 48       breast  . Gout Brother   . Allergic rhinitis Brother   . Rheum arthritis Maternal Grandmother   . Allergic rhinitis Sister   . Angioedema Neg Hx    . Asthma Neg Hx   . Eczema Neg Hx   . Immunodeficiency Neg Hx   . Urticaria Neg Hx     Social History Social History   Tobacco Use  . Smoking status: Never Smoker  . Smokeless tobacco: Never Used  Substance Use Topics  . Alcohol use: Not Currently    Alcohol/week: 0.0 standard drinks    Comment: occassional  . Drug use: No     Allergies   Iodine; Levaquin [levofloxacin in d5w]; Sulfa antibiotics; Shellfish allergy; and Apple   Review of Systems Review of Systems  Constitutional: Negative for chills and fever.  Cardiovascular: Negative for chest pain and palpitations.  Gastrointestinal: Positive for nausea and vomiting. Negative for abdominal pain and diarrhea.  Genitourinary: Negative for dysuria, flank pain, frequency and hematuria.  Musculoskeletal: Negative for back pain and myalgias.  Neurological: Negative for dizziness, syncope, light-headedness and headaches.     Physical Exam Triage Vital Signs ED Triage Vitals  Enc Vitals Group     BP 01/04/19 1011 121/80     Pulse Rate 01/04/19 1011 79     Resp --      Temp 01/04/19 1011 97.9 F (36.6 C)     Temp Source 01/04/19 1011 Oral     SpO2 01/04/19 1011 100 %     Weight 01/04/19 1012 202 lb (91.6 kg)     Height 01/04/19 1012 5\' 8"  (1.727 m)     Head Circumference --      Peak Flow --      Pain Score 01/04/19 1012 0     Pain Loc --      Pain Edu? --      Excl. in [place]? --    No data found.  Updated Vital Signs BP 121/80 (BP Location: Right Arm)   Pulse 79   Temp 97.9 F (36.6 C) (Oral)   Ht 5\' 8"  (1.727 m)   Wt 202 lb (91.6 kg)   LMP 11/11/2018   SpO2 100%   BMI 30.71 kg/m   Visual Acuity Right Eye Distance:   Left Eye Distance:   Bilateral Distance:    Right Eye Near:   Left Eye Near:    Bilateral Near:     Physical Exam Vitals  signs and nursing note reviewed.  Constitutional:      Appearance: Normal appearance. She is well-developed.  HENT:     Head: Normocephalic and atraumatic.      Mouth/Throat:     Mouth: Mucous membranes are moist.  Eyes:     Conjunctiva/sclera: Conjunctivae normal.  Neck:     Musculoskeletal: Normal range of motion.  Cardiovascular:     Rate and Rhythm: Normal rate and regular rhythm.  Pulmonary:     Effort: Pulmonary effort is normal.     Breath sounds: Normal breath sounds.  Abdominal:     General: There is no distension.     Palpations: Abdomen is soft.     Tenderness: There is no abdominal tenderness. There is no right CVA tenderness or left CVA tenderness.  Musculoskeletal: Normal range of motion.  Skin:    General: Skin is warm and dry.  Neurological:     Mental Status: She is alert and oriented to person, place, and time.  Psychiatric:        Behavior: Behavior normal.      UC Treatments / Results  Labs (all labs ordered are listed, but only abnormal results are displayed) Labs Reviewed - No data to display  EKG None  Radiology No results found.  Procedures Procedures (including critical care time)  Medications Ordered in UC Medications  ondansetron (ZOFRAN-ODT) disintegrating tablet 4 mg (4 mg Oral Given 01/04/19 1032)    Initial Impression / Assessment and Plan / UC Course  I have reviewed the triage vital signs and the nursing notes.  Pertinent labs & imaging results that were available during my care of the patient were reviewed by me and considered in my medical decision making (see chart for details).     Pt appears well, NAD Able to keep down several ounces of water in UC Discussed tx with zofran vs diclegis, pt would like to try diclegis first Encouraged f/u with OB/GYN if not improving in 2-3 days. Discussed symptoms that warrant emergent care in the ED.  Final Clinical Impressions(s) / UC Diagnoses   Final diagnoses:  Nausea/vomiting in pregnancy     Discharge Instructions      Please try the medication prescribed today for nausea and vomiting related to early pregnancy. Be sure to stay  well hydrated with slushees, popsicles, ice chips, broth, or small sips of Gatorade.    If symptoms not improving, call your OB/GYN to schedule an appointment to discuss better management of nausea and vomiting early in pregnancy. This will not be the same type of visit as your first prenatal appointment, scheduled for February 10th.    If symptoms worsen significantly this evening or over the weekend, please call 911 or go to [facility] for further evaluation and treatment including possible IV fluids.    ED Prescriptions    Medication Sig Dispense Auth. Provider   Doxylamine-Pyridoxine 10-10 MG TBEC Day 1 & 2: Two tabs at bedtime, Day 3: one tab AM and two tabs at bedtime, Day 4: one tab AM, one tab mid-afternoon, two tabs bedtime 60 tablet Noe Gens, PA-C     Controlled Substance Prescriptions [place] Controlled Substance Registry consulted? Not Applicable   Tyrell Antonio 01/04/19 [id number]

## 2019-01-15 ENCOUNTER — Other Ambulatory Visit: Payer: Self-pay | Admitting: *Deleted

## 2019-01-15 ENCOUNTER — Ambulatory Visit: Payer: 59 | Admitting: Obstetrics & Gynecology

## 2019-01-15 ENCOUNTER — Encounter: Payer: Self-pay | Admitting: Obstetrics & Gynecology

## 2019-01-15 ENCOUNTER — Ambulatory Visit (INDEPENDENT_AMBULATORY_CARE_PROVIDER_SITE_OTHER): Payer: 59 | Admitting: Obstetrics & Gynecology

## 2019-01-15 DIAGNOSIS — Z113 Encounter for screening for infections with a predominantly sexual mode of transmission: Secondary | ICD-10-CM

## 2019-01-15 DIAGNOSIS — Z3490 Encounter for supervision of normal pregnancy, unspecified, unspecified trimester: Secondary | ICD-10-CM | POA: Insufficient documentation

## 2019-01-15 DIAGNOSIS — Z3481 Encounter for supervision of other normal pregnancy, first trimester: Secondary | ICD-10-CM | POA: Diagnosis not present

## 2019-01-15 DIAGNOSIS — Z3480 Encounter for supervision of other normal pregnancy, unspecified trimester: Secondary | ICD-10-CM | POA: Diagnosis not present

## 2019-01-15 DIAGNOSIS — O09291 Supervision of pregnancy with other poor reproductive or obstetric history, first trimester: Secondary | ICD-10-CM

## 2019-01-15 DIAGNOSIS — Z3A01 Less than 8 weeks gestation of pregnancy: Secondary | ICD-10-CM | POA: Diagnosis not present

## 2019-01-15 DIAGNOSIS — O09299 Supervision of pregnancy with other poor reproductive or obstetric history, unspecified trimester: Secondary | ICD-10-CM | POA: Insufficient documentation

## 2019-01-15 MED ORDER — TRIAMCINOLONE 0.1 % CREAM:EUCERIN CREAM 1:1: 1.0000 "application " | TOPICAL_CREAM | Freq: Two times a day (BID) | CUTANEOUS | 12 refills | Status: DC

## 2019-01-15 NOTE — Progress Notes (Signed)
Subjective:    Kaylee Anderson is a G2P1001 [redacted]w[redacted]d being seen today for her first obstetrical visit.  Her obstetrical history is significant for history of post partum hemorrhage and fast labor.  Pt +Rhematoid factor but not diagnosed with RA.    Patient reports nausea, vomiting and constipation.  Vitals:   01/15/19 1300  BP: 116/73  Pulse: 89  Weight: 201 lb (91.2 kg)    HISTORY: OB History  Gravida Para Term Preterm AB Living  2 1 1     1   SAB TAB Ectopic Multiple Live Births               # Outcome Date GA Lbr Len/2nd Weight Sex Delivery Anes PTL Lv  2 Current           1 Term 10/20/12   6 lb 4 oz (2.835 kg)  Vag-Spont None N    Past Medical History:  Diagnosis Date  . Abnormal Pap smear of cervix 2008   cryo  . Dysthymia 06/21/2017  . Fibromyalgia   . HGSIL on cytologic smear of cervix    s/p colposcopy, most recent pap normal  . LGSIL on Pap smear of cervix   . Multiple lipomas   . PCOS (polycystic ovarian syndrome)   . Rheumatoid arthritis ([place]) 06/21/2017  . Trichomonal cervicitis    Past Surgical History:  Procedure Laterality Date  . CHOLECYSTECTOMY    . COLPOSCOPY    . KNEE ARTHROSCOPY Left 2002  . LIPOMA EXCISION    . PARTIAL GASTRECTOMY     Family History  Problem Relation Age of Onset  . Hypertension Mother   . Diabetes Mother   . Cancer Mother 1       breast  . Allergic rhinitis Mother   . Sarcoidosis Father   . Non-Hodgkin's lymphoma Father   . Hypertension Father   . Allergic rhinitis Father   . Food Allergy Father        shellfish allergy and iodine/contrast dye allergy  . Cancer Paternal Aunt 49       breast   . Allergic rhinitis Paternal Aunt   . Cancer Paternal Aunt 41       ovarian  . Cancer Maternal Aunt 48       breast  . Allergic rhinitis Maternal Aunt   . Cancer Maternal Aunt 50       breast and ovarian  . Cancer Maternal Aunt 49       breast  . Cancer Maternal Aunt 48       breast  . Gout Brother   . Allergic rhinitis  Brother   . Rheum arthritis Maternal Grandmother   . Allergic rhinitis Sister   . Angioedema Neg Hx   . Asthma Neg Hx   . Eczema Neg Hx   . Immunodeficiency Neg Hx   . Urticaria Neg Hx      Exam    Uterus:     Pelvic Exam:    Perineum: No Hemorrhoids   Vulva: Whitening of labia--biopsy last year benign; steroid eucerin cream helped but she has run out.   Vagina:  normal mucosa   pH: n/a   Cervix: no cervical motion tenderness   Adnexa: not evaluated   Bony Pelvis: average  System: Breast:  normal appearance, no masses or tenderness   Skin: normal coloration and turgor, no rashes    Neurologic: oriented, normal mood   Extremities: normal strength, tone, and muscle mass   HEENT sclera clear,  anicteric, oropharynx clear, no lesions, neck supple with midline trachea, thyroid without masses and trachea midline   Mouth/Teeth mucous membranes moist, pharynx normal without lesions and dental hygiene good   Neck supple and no masses   Cardiovascular: regular rate and rhythm   Respiratory:  appears well, vitals normal, no respiratory distress, acyanotic, normal RR, chest clear, no wheezing, crepitations, rhonchi, normal symmetric air entry   Abdomen: soft, non-tender; bowel sounds normal; no masses,  no organomegaly   Urinary: urethral meatus normal      Assessment:    Pregnancy: G2P1001 Patient Active Problem List   Diagnosis Date Noted  . Supervision of normal pregnancy 01/15/2019  . History of postpartum hemorrhage, currently pregnant 01/15/2019  . Chronic fatigue disorder 10/09/2018  . Rheumatoid factor positive 10/09/2018  . Shellfish allergy 07/03/2018  . History of multiple allergies 07/03/2018  . History of food anaphylaxis 07/03/2018  . Ear itching 07/03/2018  . Depression/fibromyalgia/chronic fatigue syndrome 12/16/2017  . Overweight (BMI 25.0-29.9) 11/24/2017  . Polyarthralgia 11/24/2017  . Family history of breast cancer gene mutation in first degree relative  06/21/2017  . Family history of cancer 12/08/2015        Plan:     Initial labs drawn. Prenatal vitamins. Problem list reviewed and updated. Genetic Screening discussed:  NIPS and AFP Ultrasound discussed; fetal survey: 18-20 weeks BRx opt schedule Pt to see rheumatologist and let us know if change in diagnosis Miralax and Colace for constipation Continue diglegis; can consider Zofran @10  weeks.  Pt aware of side effect of constipation with Zofran Fibromyalgia--pt to continue massage and going to chiropractor    Follow up in 4 weeks.    Silas [place] 01/15/2019

## 2019-01-15 NOTE — Progress Notes (Signed)
Bedside U/S shows single IUP with FHT of 187 BPM and CRL meas 21.31mm  GA is [redacted]w[redacted]d

## 2019-01-16 LAB — COMPREHENSIVE METABOLIC PANEL
AG Ratio: 1.2 (calc) (ref 1.0–2.5)
ALKALINE PHOSPHATASE (APISO): 89 U/L (ref 31–125)
ALT: 8 U/L (ref 6–29)
AST: 11 U/L (ref 10–30)
Albumin: 3.7 g/dL (ref 3.6–5.1)
BUN: 8 mg/dL (ref 7–25)
CO2: 23 mmol/L (ref 20–32)
Calcium: 8.8 mg/dL (ref 8.6–10.2)
Chloride: 101 mmol/L (ref 98–110)
Creat: 0.72 mg/dL (ref 0.50–1.10)
Globulin: 3.1 g/dL (calc) (ref 1.9–3.7)
Glucose, Bld: 87 mg/dL (ref 65–99)
Potassium: 4.1 mmol/L (ref 3.5–5.3)
Sodium: 134 mmol/L — ABNORMAL LOW (ref 135–146)
Total Bilirubin: 0.4 mg/dL (ref 0.2–1.2)
Total Protein: 6.8 g/dL (ref 6.1–8.1)

## 2019-01-16 LAB — OBSTETRIC PANEL
Absolute Monocytes: 442 cells/uL (ref 200–950)
Antibody Screen: NOT DETECTED
Basophils Absolute: 19 cells/uL (ref 0–200)
Basophils Relative: 0.3 %
Eosinophils Absolute: 83 cells/uL (ref 15–500)
Eosinophils Relative: 1.3 %
HCT: 34.1 % — ABNORMAL LOW (ref 35.0–45.0)
Hemoglobin: 11.6 g/dL — ABNORMAL LOW (ref 11.7–15.5)
Hepatitis B Surface Ag: NONREACTIVE
Lymphs Abs: 1997 cells/uL (ref 850–3900)
MCH: 31.7 pg (ref 27.0–33.0)
MCHC: 34 g/dL (ref 32.0–36.0)
MCV: 93.2 fL (ref 80.0–100.0)
MPV: 9.8 fL (ref 7.5–12.5)
Monocytes Relative: 6.9 %
Neutro Abs: 3859 cells/uL (ref 1500–7800)
Neutrophils Relative %: 60.3 %
Platelets: 335 10*3/uL (ref 140–400)
RBC: 3.66 10*6/uL — ABNORMAL LOW (ref 3.80–5.10)
RDW: 11.7 % (ref 11.0–15.0)
RPR Ser Ql: NONREACTIVE
Rubella: 1.86 index
Total Lymphocyte: 31.2 %
WBC: 6.4 10*3/uL (ref 3.8–10.8)

## 2019-01-16 LAB — URINE CYTOLOGY ANCILLARY ONLY
Chlamydia: NEGATIVE
Neisseria Gonorrhea: NEGATIVE

## 2019-01-16 LAB — HEMOGLOBIN A1C
EAG (MMOL/L): 5 (calc)
Hgb A1c MFr Bld: 4.8 % of total Hgb (ref <5.7)
Mean Plasma Glucose: 91 (calc)

## 2019-01-16 LAB — TSH: TSH: 0.45 mIU/L

## 2019-01-16 LAB — HIV ANTIBODY (ROUTINE TESTING W REFLEX): HIV 1&2 Ab, 4th Generation: NONREACTIVE

## 2019-01-16 MED FILL — CMP-TAC.1%CR/EUCERINCR 1:1: 7 days supply | Qty: 60 | Fill #0

## 2019-01-17 LAB — CULTURE, OB URINE

## 2019-01-17 LAB — URINE CULTURE, OB REFLEX

## 2019-01-22 ENCOUNTER — Other Ambulatory Visit: Payer: Self-pay | Admitting: *Deleted

## 2019-01-22 MED ORDER — DOXYLAMINE-PYRIDOXINE 10-10 MG PO TBEC: DELAYED_RELEASE_TABLET | ORAL | 6 refills | Status: DC

## 2019-01-22 MED FILL — DOXYLAMINE-PYRIDOXINE 10-10: 10-10 | 30 days supply | Qty: 60 | Fill #0

## 2019-02-07 DIAGNOSIS — E669 Obesity, unspecified: Secondary | ICD-10-CM | POA: Diagnosis not present

## 2019-02-07 DIAGNOSIS — M2550 Pain in unspecified joint: Secondary | ICD-10-CM | POA: Diagnosis not present

## 2019-02-07 DIAGNOSIS — Z6831 Body mass index (BMI) 31.0-31.9, adult: Secondary | ICD-10-CM | POA: Diagnosis not present

## 2019-02-07 DIAGNOSIS — R768 Other specified abnormal immunological findings in serum: Secondary | ICD-10-CM | POA: Diagnosis not present

## 2019-02-13 ENCOUNTER — Ambulatory Visit (INDEPENDENT_AMBULATORY_CARE_PROVIDER_SITE_OTHER): Payer: 59 | Admitting: Advanced Practice Midwife

## 2019-02-13 VITALS — BP 131/76 | HR 85 | Wt 203.0 lb

## 2019-02-13 DIAGNOSIS — O219 Vomiting of pregnancy, unspecified: Secondary | ICD-10-CM

## 2019-02-13 DIAGNOSIS — Z3480 Encounter for supervision of other normal pregnancy, unspecified trimester: Secondary | ICD-10-CM

## 2019-02-13 DIAGNOSIS — Z3A12 12 weeks gestation of pregnancy: Secondary | ICD-10-CM

## 2019-02-13 MED ORDER — ONDANSETRON 4 MG PO TBDP: 4.0000 mg | ORAL_TABLET | Freq: Four times a day (QID) | ORAL | 5 refills | Status: DC | PRN

## 2019-02-13 MED FILL — ONDANSETRON ODT 4 MG TABLET: 4 | 5 days supply | Qty: 20 | Fill #0

## 2019-02-13 NOTE — Progress Notes (Addendum)
   PRENATAL VISIT NOTE  Subjective:  Kaylee Anderson is a 33 y.o. G2P1001 at [redacted]w[redacted]d being seen today for ongoing prenatal care.  She is currently monitored for the following issues for this low-risk pregnancy and has Family history of cancer; Family history of breast cancer gene mutation in first degree relative; Overweight (BMI 25.0-29.9); Polyarthralgia; Depression/fibromyalgia/chronic fatigue syndrome; Shellfish allergy; History of multiple allergies; History of food anaphylaxis; Ear itching; Chronic fatigue disorder; Rheumatoid factor positive; Supervision of normal pregnancy; and History of postpartum hemorrhage, currently pregnant on their problem list.  Patient reports nausea and vomiting.  Contractions: Not present. Vag. Bleeding: None.  Movement: Absent. Denies leaking of fluid.   The following portions of the patient's history were reviewed and updated as appropriate: allergies, current medications, past family history, past medical history, past social history, past surgical history and problem list.   Objective:   Vitals:   02/13/19 1450  BP: 131/76  Pulse: 85  Weight: 203 lb (92.1 kg)    Fetal Status: Fetal Heart Rate (bpm): 159   Movement: Absent     General:  Alert, oriented and cooperative. Patient is in no acute distress.  Skin: Skin is warm and dry. No rash noted.   Cardiovascular: Normal heart rate noted  Respiratory: Normal respiratory effort, no problems with respiration noted  Abdomen: Soft, gravid, appropriate for gestational age. Pain/Pressure: Absent     Pelvic: Cervical exam deferred        Extremities: Normal range of motion.  Edema: None  Mental Status: Normal mood and affect. Normal behavior. Normal judgment and thought content.   Assessment and Plan:  Pregnancy: G2P1001 at [redacted]w[redacted]d 1. Supervision of other normal pregnancy, antepartum - Routine prenatal care. - Anticipatory guidance given. - Babyscripts Schedule Optimization - Genetic Screening  Nausea and  vomiting during pregnancy prior to [redacted] weeks gestation -Continue Diclegis as previously ordered -Zofran 4mg  ODT q 6 hrs prn in addition to Diclegis. Discussed risks, benefits, and adverse events. -Educated to take PNV in evening and eat first thing upon waking to help reduce nausea.   Preterm labor symptoms and general obstetric precautions including but not limited to vaginal bleeding, contractions, leaking of fluid and fetal movement were reviewed in detail with the patient. Please refer to After Visit Summary for other counseling recommendations.   No follow-ups on file.  Future Appointments  Date Time Provider [place]  03/29/2019 11:15 AM [place] 4 WH-MFCUS MFC-US  04/05/2019  3:45 PM Gala Romney Fredderick Phenix, MD [place] Rosana Hoes, SNP  I confirm that I have verified the information documented in the nurse practitioner student's note and that I have also personally reperformed the history, physical exam and all medical decision making activities of this service and have verified that all service and findings are accurately documented in this student's note.   Discussed side effects of Zofran including constipation with pt. Use sparingly, add high fiber diet, increased PO fluids. Pt states understanding.  Simona Huh 02/13/2019 5:33 PM

## 2019-02-19 MED FILL — ONDANSETRON ODT 4 MG TABLET: 4 | 5 days supply | Qty: 20 | Fill #1

## 2019-02-21 ENCOUNTER — Encounter: Payer: Self-pay | Admitting: Advanced Practice Midwife

## 2019-02-22 ENCOUNTER — Telehealth: Payer: Self-pay | Admitting: *Deleted

## 2019-02-22 MED ORDER — ONDANSETRON HCL 8 MG PO TABS: 8.0000 mg | ORAL_TABLET | Freq: Three times a day (TID) | ORAL | 0 refills | Status: DC | PRN

## 2019-02-22 MED FILL — ONDANSETRON HCL 8 MG TABLET: 8 | 10 days supply | Qty: 30 | Fill #0

## 2019-02-22 NOTE — Telephone Encounter (Signed)
Pt sent email stating that she wasn't getting the relief from Zofran 4 mg ODT that she needed.  She is requesting to change to 8 mg PO tablets.  This was sent to [facility] outpatient pharmacy.

## 2019-03-05 ENCOUNTER — Encounter: Payer: Self-pay | Admitting: *Deleted

## 2019-03-05 DIAGNOSIS — Z3480 Encounter for supervision of other normal pregnancy, unspecified trimester: Secondary | ICD-10-CM

## 2019-03-08 ENCOUNTER — Other Ambulatory Visit: Payer: Self-pay

## 2019-03-09 MED ORDER — ONDANSETRON HCL 8 MG PO TABS: 8.0000 mg | ORAL_TABLET | Freq: Three times a day (TID) | ORAL | 0 refills | Status: DC | PRN

## 2019-03-12 MED FILL — ONDANSETRON HCL 8 MG TABLET: 8 | 10 days supply | Qty: 30 | Fill #0

## 2019-03-23 ENCOUNTER — Encounter: Payer: Self-pay | Admitting: Physician Assistant

## 2019-03-23 ENCOUNTER — Telehealth (INDEPENDENT_AMBULATORY_CARE_PROVIDER_SITE_OTHER): Payer: 59 | Admitting: Physician Assistant

## 2019-03-23 VITALS — BP 111/64 | HR 74 | Wt 204.0 lb

## 2019-03-23 DIAGNOSIS — F329 Major depressive disorder, single episode, unspecified: Secondary | ICD-10-CM

## 2019-03-23 DIAGNOSIS — M797 Fibromyalgia: Secondary | ICD-10-CM | POA: Diagnosis not present

## 2019-03-23 DIAGNOSIS — R5382 Chronic fatigue, unspecified: Secondary | ICD-10-CM

## 2019-03-23 DIAGNOSIS — O99342 Other mental disorders complicating pregnancy, second trimester: Secondary | ICD-10-CM

## 2019-03-23 DIAGNOSIS — F32A Depression, unspecified: Secondary | ICD-10-CM

## 2019-03-23 MED ORDER — FLUOXETINE HCL 10 MG PO CAPS: ORAL_CAPSULE | ORAL | 0 refills | Status: DC

## 2019-03-23 MED ORDER — ACETAMINOPHEN 500 MG PO TABS: 1000.0000 mg | ORAL_TABLET | Freq: Four times a day (QID) | ORAL | 0 refills | Status: AC | PRN

## 2019-03-23 MED ORDER — CYCLOBENZAPRINE HCL 5 MG PO TABS: 5.0000 mg | ORAL_TABLET | Freq: Every day | ORAL | 0 refills | Status: AC

## 2019-03-23 NOTE — Progress Notes (Signed)
Virtual Visit via Video Note  I connected with Kaylee Anderson on 03/23/19 at 10:50 AM EDT by a video enabled telemedicine application and verified that I am speaking with the correct person using two identifiers.   I discussed the limitations of evaluation and management by telemedicine and the availability of in person appointments. The patient expressed understanding and agreed to proceed.  History of Present Illness: HPI:                                                                Kaylee Anderson is a 33 y.o. female   CC: "fibromyalgia flare"  Reports she has been struggling with her fibromyalgia symptoms lately due to [facility] virus interrupting her chiropractic and massage therapies. She does not currently take any medications for pain because she is pregnant (2nd tri). She has taken Cymbalta, Flexeril and Motrin in the past, which were helpful. Beginning of this month she started noticing trigger points "felt on fire," unable to get comfortable in any position, not sleeping well and feeling more fatigued. This has started to affect her mood and she reports she has felt depressed.  She has been out of work since Monday and is requesting FMLA for 2 weeks beginning today (03/23/19 - 04/06/19)  Past Medical History:  Diagnosis Date  . Abnormal Pap smear of cervix 2008   cryo  . Dysthymia 06/21/2017  . Fibromyalgia   . HGSIL on cytologic smear of cervix    s/p colposcopy, most recent pap normal  . LGSIL on Pap smear of cervix   . Multiple lipomas   . PCOS (polycystic ovarian syndrome)   . Rheumatoid arthritis ([place]) 06/21/2017  . Trichomonal cervicitis    Past Surgical History:  Procedure Laterality Date  . CHOLECYSTECTOMY    . COLPOSCOPY    . KNEE ARTHROSCOPY Left 2002  . LIPOMA EXCISION    . PARTIAL GASTRECTOMY     Social History   Tobacco Use  . Smoking status: Never Smoker  . Smokeless tobacco: Never Used  Substance Use Topics  . Alcohol use: Not Currently    Alcohol/week:  0.0 standard drinks    Comment: occassional   family history includes Allergic rhinitis in her brother, father, maternal aunt, mother, paternal aunt, and sister; Cancer (age of onset: 73) in her paternal aunt; Cancer (age of onset: 74) in her paternal aunt; Cancer (age of onset: 19) in her maternal aunt, maternal aunt, and mother; Cancer (age of onset: 46) in her maternal aunt; Cancer (age of onset: 68) in her maternal aunt; Diabetes in her mother; Food Allergy in her father; Gout in her brother; Hypertension in her father and mother; Non-Hodgkin's lymphoma in her father; Rheum arthritis in her maternal grandmother; Sarcoidosis in her father.    ROS: negative except as noted in the HPI  Medications: Current Outpatient Medications  Medication Sig Dispense Refill  . Doxylamine-Pyridoxine 10-10 MG TBEC Day 1 & 2: Two tabs at bedtime, Day 3: one tab AM and two tabs at bedtime, Day 4: one tab AM, one tab mid-afternoon, two tabs bedtime 60 tablet 6  . EPINEPHrine (EPIPEN 2-PAK) 0.3 mg/0.3 mL IJ SOAJ injection Inject 0.3 mLs (0.3 mg total) into the muscle once as needed for up to 1 dose. 2 Device 1  .  ondansetron (ZOFRAN) 8 MG tablet Take 1 tablet (8 mg total) by mouth every 8 (eight) hours as needed for nausea or vomiting. 30 tablet 0  . Prenatal Vit-Fe Fumarate-FA (PRENATAL VITAMIN PO) Take by mouth.    . Triamcinolone Acetonide (TRIAMCINOLONE 0.1 % CREAM : EUCERIN) CREA Apply 1 application topically 2 (two) times daily. (Patient not taking: Reported on 02/13/2019) 60 each 12   Current Facility-Administered Medications  Medication Dose Route Frequency Provider Last Rate Last Dose  . triamcinolone 0.1 % cream : eucerin cream, 1:1   Topical BID Guss Bunde, MD       Allergies  Allergen Reactions  . Iodine Anaphylaxis  . Levaquin [Levofloxacin In D5w] Itching    Headaches, vomiting  . Sulfa Antibiotics Itching    Migraine, vomiting  . Shellfish Allergy Swelling  . Apple Itching        Objective:  BP 111/64   Pulse 74   Wt 204 lb (92.5 kg)   LMP 11/11/2018   BMI 31.02 kg/m  Gen:  alert, not ill-appearing, no distress, appropriate for age 46: head normocephalic without obvious abnormality, conjunctiva and cornea clear, wearing glasses, trachea midline Pulm: Normal work of breathing, normal phonation, speaking in full sentences Neuro: alert and oriented x 3 Psych: cooperative, depressed mood, affect mood-congruent, holding back tears at the end of the visit, speech is articulate, normal rate and volume; thought processes clear and goal-directed, normal judgment, good insight    No results found for this or any previous visit (from the past 72 hour(s)). No results found.    Assessment and Plan: 33 y.o. female with   .Diagnoses and all orders for this visit:  Depression during pregnancy in second trimester -     FLUoxetine (PROZAC) 10 MG capsule; Take 1 capsule (10 mg total) by mouth at bedtime for 3 days, THEN 2 capsules (20 mg total) at bedtime for 27 days.  Chronic fatigue syndrome with fibromyalgia -     FLUoxetine (PROZAC) 10 MG capsule; Take 1 capsule (10 mg total) by mouth at bedtime for 3 days, THEN 2 capsules (20 mg total) at bedtime for 27 days. -     acetaminophen (TYLENOL) 500 MG tablet; Take 2 tablets (1,000 mg total) by mouth every 6 (six) hours as needed for moderate pain. -     cyclobenzaprine (FLEXERIL) 5 MG tablet; Take 1-2 tablets (5-10 mg total) by mouth at bedtime.  Start Tylenol [phone] mg every 6 hours Starting Fluoxetine for depression and fibromyalgia pain Re-start Flexeril 5 mg at beditme with option to increase to 10 mg if needed We reviewed risks of medication in pregnancy. Both Tylenol and Flexeril are Category B while Fluoxetine is Category C, but one of the well studied SSRI's in pregnancy. Detailed information was sent via MyChart for patient to review and ask follow-up questions  FMLA forms for continuous leave  4/17-5/01 completed and faxed today  Follow-up in 1 month  Follow Up Instructions:    I discussed the assessment and treatment plan with the patient. The patient was provided an opportunity to ask questions and all were answered. The patient agreed with the plan and demonstrated an understanding of the instructions.   The patient was advised to call back or seek an in-person evaluation if the symptoms worsen or if the condition fails to improve as anticipated.  I provided 25 minutes of non-face-to-face time during this encounter.   Trixie Dredge, [place]

## 2019-03-29 ENCOUNTER — Other Ambulatory Visit: Payer: Self-pay

## 2019-03-29 ENCOUNTER — Ambulatory Visit (HOSPITAL_COMMUNITY)
Admission: RE | Admit: 2019-03-29 | Discharge: 2019-03-29 | Disposition: A | Discharge status: Home / Self Care | Payer: 59 | Source: Ambulatory Visit | Attending: Obstetrics and Gynecology | Admitting: Obstetrics and Gynecology

## 2019-03-29 DIAGNOSIS — O99842 Bariatric surgery status complicating pregnancy, second trimester: Secondary | ICD-10-CM | POA: Diagnosis not present

## 2019-03-29 DIAGNOSIS — Z3A19 19 weeks gestation of pregnancy: Secondary | ICD-10-CM

## 2019-03-29 DIAGNOSIS — Z3480 Encounter for supervision of other normal pregnancy, unspecified trimester: Secondary | ICD-10-CM | POA: Insufficient documentation

## 2019-03-29 DIAGNOSIS — Z363 Encounter for antenatal screening for malformations: Secondary | ICD-10-CM

## 2019-03-29 DIAGNOSIS — O2692 Pregnancy related conditions, unspecified, second trimester: Secondary | ICD-10-CM | POA: Diagnosis not present

## 2019-04-03 ENCOUNTER — Other Ambulatory Visit: Payer: Self-pay | Admitting: Physician Assistant

## 2019-04-03 DIAGNOSIS — M549 Dorsalgia, unspecified: Secondary | ICD-10-CM

## 2019-04-03 DIAGNOSIS — M797 Fibromyalgia: Secondary | ICD-10-CM

## 2019-04-05 ENCOUNTER — Other Ambulatory Visit: Payer: Self-pay

## 2019-04-05 ENCOUNTER — Ambulatory Visit (INDEPENDENT_AMBULATORY_CARE_PROVIDER_SITE_OTHER): Payer: 59 | Admitting: Obstetrics & Gynecology

## 2019-04-05 VITALS — BP 112/76 | HR 82 | Wt 207.0 lb

## 2019-04-05 DIAGNOSIS — Z3A20 20 weeks gestation of pregnancy: Secondary | ICD-10-CM | POA: Diagnosis not present

## 2019-04-05 DIAGNOSIS — Z3480 Encounter for supervision of other normal pregnancy, unspecified trimester: Secondary | ICD-10-CM | POA: Diagnosis not present

## 2019-04-05 DIAGNOSIS — Z3482 Encounter for supervision of other normal pregnancy, second trimester: Secondary | ICD-10-CM

## 2019-04-05 NOTE — Progress Notes (Signed)
   PRENATAL VISIT NOTE  Subjective:  Kaylee Anderson is a 33 y.o. G2P1001 at [redacted]w[redacted]d being seen today for ongoing prenatal care.  She is currently monitored for the following issues for this high-risk pregnancy and has Family history of cancer; Family history of breast cancer gene mutation in first degree relative; Overweight (BMI 25.0-29.9); Polyarthralgia; Depression/fibromyalgia/chronic fatigue syndrome; Shellfish allergy; History of multiple allergies; History of food anaphylaxis; Ear itching; Chronic fatigue syndrome with fibromyalgia; Rheumatoid factor positive; Supervision of normal pregnancy; and History of postpartum hemorrhage, currently pregnant on their problem list.  Patient reports muscle and joint aches form fibromyalgia--doing better with tylenol and flexeril.  Contractions: Not present. Vag. Bleeding: None.  Movement: Present. Denies leaking of fluid.   The following portions of the patient's history were reviewed and updated as appropriate: allergies, current medications, past family history, past medical history, past social history, past surgical history and problem list.   Objective:   Vitals:   04/05/19 1544  BP: 112/76  Pulse: 82  Weight: 207 lb (93.9 kg)    Fetal Status: Fetal Heart Rate (bpm): 143   Movement: Present     General:  Alert, oriented and cooperative. Patient is in no acute distress.  Skin: Skin is warm and dry. No rash noted.   Cardiovascular: Normal heart rate noted  Respiratory: Normal respiratory effort, no problems with respiration noted  Abdomen: Soft, gravid, appropriate for gestational age.  Pain/Pressure: Absent     Pelvic: Cervical exam deferred        Extremities: Normal range of motion.  Edema: None  Mental Status: Normal mood and affect. Normal behavior. Normal judgment and thought content.   Assessment and Plan:  Pregnancy: G2P1001 at [redacted]w[redacted]d 1. Supervision of high risk pregnancy, antepartum - Pain at pubic symphysis--pt has PT appt  tomorrow and will have them address - continue tylenol and flexeril - Alpha fetoprotein, maternal - [place] MFM OB FOLLOW UP; Future  2.   - MFM growth [place] ordered - Next in person visit is 28 weeks - Continue weekly babyscripts readings  Preterm labor symptoms and general obstetric precautions including but not limited to vaginal bleeding, contractions, leaking of fluid and fetal movement were reviewed in detail with the patient. Please refer to After Visit Summary for other counseling recommendations.   Return in about 8 weeks (around 05/31/2019).  Future Appointments  Date Time Provider [place]  04/06/2019  9:00 AM Gillermo Murdoch Beatty, [place] [facility]    Silas [place], MD

## 2019-04-06 ENCOUNTER — Encounter: Payer: Self-pay | Admitting: Rehabilitative and Restorative Service Providers"

## 2019-04-06 ENCOUNTER — Ambulatory Visit: Payer: 59 | Admitting: Rehabilitative and Restorative Service Providers"

## 2019-04-06 DIAGNOSIS — M797 Fibromyalgia: Secondary | ICD-10-CM

## 2019-04-06 DIAGNOSIS — R29898 Other symptoms and signs involving the musculoskeletal system: Secondary | ICD-10-CM | POA: Diagnosis not present

## 2019-04-06 DIAGNOSIS — M6281 Muscle weakness (generalized): Secondary | ICD-10-CM

## 2019-04-06 LAB — ALPHA FETOPROTEIN, MATERNAL
AFP MoM: 0.64
AFP, Serum: 33.8 ng/mL
Calc'd Gestational Age: 20.1 weeks
Maternal Wt: 203 [lb_av]
Risk for ONTD: <1
Twins-AFP: 1

## 2019-04-06 NOTE — Patient Instructions (Signed)
Abdominal Bracing With Pelvic Floor (Hook-Lying)    With neutral spine, exhale slowly as you tighten pelvic floor and abdominals sucking belly button to back bone; tense muscles in the low back at waist.  Inhale and relax everything. Repeat _10__ times. Do _several __ times a day. Progress to do this in sitting; standing; walking and with functional activities    Butterfly, Supine    Lie on back, feet together. Lower knees toward floor. Hold __20-30_ seconds. Repeat _3__ times per session. Do _2__ sessions per day.    Trigger Point Dry Needling  . What is Trigger Point Dry Needling (DN)? o DN is a physical therapy technique used to treat muscle pain and dysfunction. Specifically, DN helps deactivate muscle trigger points (muscle knots).  o A thin filiform needle is used to penetrate the skin and stimulate the underlying trigger point. The goal is for a local twitch response (LTR) to occur and for the trigger point to relax. No medication of any kind is injected during the procedure.   . What Does Trigger Point Dry Needling Feel Like?  o The procedure feels different for each individual patient. Some patients report that they do not actually feel the needle enter the skin and overall the process is not painful. Very mild bleeding may occur. However, many patients feel a deep cramping in the muscle in which the needle was inserted. This is the local twitch response.   Marland Kitchen How Will I feel after the treatment? o Soreness is normal, and the onset of soreness may not occur for a few hours. Typically this soreness does not last longer than two days.  o Bruising is uncommon, however; ice can be used to decrease any possible bruising.  o In rare cases feeling tired or nauseous after the treatment is normal. In addition, your symptoms may get worse before they get better, this period will typically not last longer than 24 hours.   . What Can I do After My Treatment? o Increase your hydration by  drinking more water for the next 24 hours. o You may place ice or heat on the areas treated that have become sore, however, do not use heat on inflamed or bruised areas. Heat often brings more relief post needling. o You can continue your regular activities, but vigorous activity is not recommended initially after the treatment for 24 hours. o DN is best combined with other physical therapy such as strengthening, stretching, and other therapies.

## 2019-04-06 NOTE — Therapy (Signed)
[place], [place], [address] Phone: [phone]   Fax:  [phone]  Physical Therapy Evaluation  Patient Details  Name: Kaylee Anderson MRN: [id number] Date of Birth: November 25, 1986 Referring Provider (PT): Nelson Chimes, [place]    Encounter Date: 04/06/2019  PT End of Session - 04/06/19 1248    Visit Number  1    Number of Visits  12    Date for PT Re-Evaluation  05/18/19    PT Start Time  0903    PT Stop Time  1000    PT Time Calculation (min)  57 min    Activity Tolerance  Patient tolerated treatment well       Past Medical History:  Diagnosis Date  . Abnormal Pap smear of cervix 2008   cryo  . Dysthymia 06/21/2017  . Fibromyalgia   . HGSIL on cytologic smear of cervix    s/p colposcopy, most recent pap normal  . LGSIL on Pap smear of cervix   . Multiple lipomas   . PCOS (polycystic ovarian syndrome)   . Rheumatoid arthritis ([place]) 06/21/2017  . Trichomonal cervicitis     Past Surgical History:  Procedure Laterality Date  . CHOLECYSTECTOMY    . COLPOSCOPY    . KNEE ARTHROSCOPY Left 2002  . LIPOMA EXCISION    . PARTIAL GASTRECTOMY      There were no vitals filed for this visit.   Subjective Assessment - 04/06/19 0908    Subjective  Patient reports that she has a history of fibromyalgia and chronic fatigue for >10 years. Symptoms have increased in the past past six weeks. She is [redacted] weeks pregnant with 2nd child and is unable to take medication that she normally uses to control symptoms.     Pertinent History  Fibromyalgia; chronic fatigue     Patient Stated Goals  decrease pain and get to the end of the pregnancy with less pain     Currently in Pain?  Yes    Pain Score  8     Pain Location  Generalized    Pain Descriptors / Indicators  Dull;Constant;Burning    Pain Type  Chronic pain    Pain Radiating Towards  burning in hands and feet     Pain Onset  More than a month ago    Pain  Frequency  Constant    Aggravating Factors   pregnancy; worse in the morning    Pain Relieving Factors  sometimes stretching; massage; accupuncture          [facility] PT Assessment - 04/06/19 0001      Assessment   Medical Diagnosis  Fibromyalgia; chronic fatigue     Referring Provider (PT)  Nelson Chimes, PA-C     Onset Date/Surgical Date  02/18/19    Hand Dominance  Right    Next MD Visit  PRN     Prior Therapy  chiropractic; massage; accupuncture       Precautions   Precautions  None      Balance Screen   Has the patient fallen in the past 6 months  No    Has the patient had a decrease in activity level because of a fear of falling?   No    Is the patient reluctant to leave their home because of a fear of falling?   No      Prior Function   Level of Independence  Independent    Vocation  Full time employment  Vocation Requirements  bending; lifting; sitting; walking     Leisure  household chores; 33 yr old; 1-2 times/wk 20-30 min       Sensation   Additional Comments  burning in hands and feet around joints       Posture/Postural Control   Posture Comments  head forward; shoulders rounded and elevated; flexed forward at hips       AROM   AROM Assessment Site  --   lumbar spine WFL's some tightness end range lat flexion    Right/Left Hip  --   tightness end ranges ext; rotation      Strength   Right Hip Flexion  5/5    Right Hip Extension  4+/5    Right Hip ABduction  4+/5    Right Hip ADduction  5/5    Left Hip Flexion  5/5    Left Hip Extension  4+/5    Left Hip ABduction  4+/5    Left Hip ADduction  5/5      Flexibility   Hamstrings  tight bilat ~ 65-70 deg     Quadriceps  tight bilat     ITB  tight Rt > Lt    Piriformis  tight Rt > Lt       Palpation   Palpation comment  muscular tightness through sacrum into posterior hip abductors and extensors; lateral hip - TFL/ITB; hip flexors/adductors       Transfers   Comments  poor body mechanics with  transfers and transitional movements       Ambulation/Gait   Gait Comments  wide based gait with LE's in ER                 Objective measurements completed on examination: See above findings.      Funk Adult PT Treatment/Exercise - 04/06/19 0001      Self-Care   Self-Care  --   initiated back care education      Therapeutic Activites    Therapeutic Activities  --   myofacial ball release work      Neuro Re-ed    Neuro Re-ed Details   instructed patient to avoid standing with knees hyperextended       Lumbar Exercises: Stretches   Hip Flexor Stretch  Right;Left;3 reps;30 seconds   seated    Other Lumbar Stretch Exercise  hip adductor stretch supine 30 sec x 2 - some discomfort with bilat stretch; switched to one LE at a time       Lumbar Exercises: Supine   AB Set Limitations  4 part core 10 sec x       Moist Heat Therapy   Number Minutes Moist Heat  15 Minutes    Moist Heat Location  Lumbar Spine   hip flexors/adductors             PT Education - 04/06/19 0945    Education Details  HEP DN     Person(s) Educated  Patient    Methods  Explanation;Demonstration;Tactile cues;Verbal cues;Handout    Comprehension  Verbalized understanding;Returned demonstration;Verbal cues required;Tactile cues required          PT Long Term Goals - 04/06/19 1254      PT LONG TERM GOAL #1   Title  Decrease pain with functional activities to no more than 4/10 to 5/10 pain level 05/18/2019    Time  6    Period  Weeks    Status  New  PT LONG TERM GOAL #2   Title  Improve core and LE strength allowing patient to perform 20-30 min of exercise/walking with minimal to no increase in symptoms 05/18/2019    Time  6    Period  Weeks    Status  New      PT LONG TERM GOAL #3   Title  Independent in HEP with appropriate progression of exercises for pregnancy 05/18/2019    Time  6    Period  Weeks    Status  New             Plan - 04/06/19 1248    Clinical  Impression Statement  Kaylee Anderson presents with 1-2 month history of increased fibromyalgia pain specifically in bilat hips and thighs. She has poor posture and alignment, standing with knees hyperextended. She has decreased mobility bilat LE's; weakness through core and LE's; muscular tightness through bilat LB/hips/thighs with TP's throughout. Patient wil benefit from PT to address problems identified.     Stability/Clinical Decision Making  Stable/Uncomplicated    Clinical Decision Making  Low    Rehab Potential  Good    PT Frequency  2x / week    PT Duration  6 weeks    PT Treatment/Interventions  Patient/family education;ADLs/Self Care Home Management;Cryotherapy;Moist Heat;Dry needling;Manual techniques;Electrical Stimulation;Iontophoresis 4mg /ml Dexamethasone;Ultrasound;Therapeutic activities;Therapeutic exercise;Neuromuscular re-education    PT Next Visit Plan  review HEP; progress with body mechanics; core stabilization; address muscular tightness through hip flexors and adductors; manual work and/or DN as indicated; modalities as indicated and safe     PT Home Exercise Plan  patient provided information about Dn and will consider DN option and discuss with her OB if she decides to progress with trial     Consulted and Agree with Plan of Care  Patient       Patient will benefit from skilled therapeutic intervention in order to improve the following deficits and impairments:  Postural dysfunction, Improper body mechanics, Pain, Increased fascial restricitons, Increased muscle spasms, Hypermobility, Decreased range of motion, Decreased mobility, Decreased activity tolerance  Visit Diagnosis: Fibromyalgia - Plan: PT plan of care cert/re-cert  Other symptoms and signs involving the musculoskeletal system - Plan: PT plan of care cert/re-cert  Muscle weakness (generalized) - Plan: PT plan of care cert/re-cert     Problem List Patient Active Problem List   Diagnosis Date Noted  .  Supervision of normal pregnancy 01/15/2019  . History of postpartum hemorrhage, currently pregnant 01/15/2019  . Chronic fatigue syndrome with fibromyalgia 10/09/2018  . Rheumatoid factor positive 10/09/2018  . Shellfish allergy 07/03/2018  . History of multiple allergies 07/03/2018  . History of food anaphylaxis 07/03/2018  . Ear itching 07/03/2018  . Depression/fibromyalgia/chronic fatigue syndrome 12/16/2017  . Overweight (BMI 25.0-29.9) 11/24/2017  . Polyarthralgia 11/24/2017  . Family history of breast cancer gene mutation in first degree relative 06/21/2017  . Family history of cancer 12/08/2015     Kaylee Anderson PT, MPH  04/06/2019, 1:10 PM  [facility] [place], [place], [address] Phone: [phone]   Fax:  [phone]  Name: Kaylee Anderson MRN: [id number] Date of Birth: 09-Feb-1986

## 2019-04-07 ENCOUNTER — Inpatient Hospital Stay (HOSPITAL_COMMUNITY)
Admission: AD | Admit: 2019-04-07 | Discharge: 2019-04-07 | Disposition: A | Discharge status: Home / Self Care | Payer: 59 | Attending: Obstetrics and Gynecology | Admitting: Obstetrics and Gynecology

## 2019-04-07 ENCOUNTER — Encounter (HOSPITAL_COMMUNITY): Payer: Self-pay

## 2019-04-07 ENCOUNTER — Other Ambulatory Visit: Payer: Self-pay

## 2019-04-07 DIAGNOSIS — O26892 Other specified pregnancy related conditions, second trimester: Secondary | ICD-10-CM | POA: Insufficient documentation

## 2019-04-07 DIAGNOSIS — Z79899 Other long term (current) drug therapy: Secondary | ICD-10-CM | POA: Insufficient documentation

## 2019-04-07 DIAGNOSIS — Z3A20 20 weeks gestation of pregnancy: Secondary | ICD-10-CM | POA: Insufficient documentation

## 2019-04-07 DIAGNOSIS — M545 Low back pain: Secondary | ICD-10-CM | POA: Diagnosis not present

## 2019-04-07 DIAGNOSIS — O9A212 Injury, poisoning and certain other consequences of external causes complicating pregnancy, second trimester: Secondary | ICD-10-CM | POA: Diagnosis not present

## 2019-04-07 DIAGNOSIS — M797 Fibromyalgia: Secondary | ICD-10-CM | POA: Diagnosis not present

## 2019-04-07 DIAGNOSIS — Y92410 Unspecified street and highway as the place of occurrence of the external cause: Secondary | ICD-10-CM | POA: Insufficient documentation

## 2019-04-07 DIAGNOSIS — R102 Pelvic and perineal pain: Secondary | ICD-10-CM | POA: Insufficient documentation

## 2019-04-07 DIAGNOSIS — M542 Cervicalgia: Secondary | ICD-10-CM | POA: Diagnosis not present

## 2019-04-07 LAB — URINALYSIS, ROUTINE W REFLEX MICROSCOPIC
Bilirubin Urine: NEGATIVE
Glucose, UA: NEGATIVE mg/dL
Hgb urine dipstick: NEGATIVE
Ketones, ur: NEGATIVE mg/dL
Leukocytes,Ua: NEGATIVE
Nitrite: NEGATIVE
Protein, ur: NEGATIVE mg/dL
Specific Gravity, Urine: 1.015 (ref 1.005–1.030)
pH: 6 (ref 5.0–8.0)
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
## 2019-04-07 NOTE — Discharge Instructions (Signed)
Preventing Injuries During Pregnancy °Trauma is the most common cause of injury and death in pregnant women. This can also result in serious harm to the baby or even death. °How can injuries affect my pregnancy? °Your baby is protected in the womb (uterus) by a sac filled with fluid (amniotic sac). Your baby can be harmed if there is a direct blow to your abdomen and pelvis. Trauma may be caused by: °· Falls. These are more common in the second and third trimester of pregnancy. °· Automobile accidents. °· Domestic violence or assault. °· Severe burns, such as from fire or electricity. °These injuries can result in: °· Tearing of your uterus. °· The placenta pulling away from the wall of the uterus (placental abruption). °· The amniotic sac breaking open (rupture of membranes). °· Blockage or decrease in the blood supply to your baby. °· Going into labor earlier than expected. °· Severe injuries to other parts of your body, such as your brain, spine, heart, lungs, or other organs. °Minor falls and low-impact automobile accidents do not usually harm your baby, even if they cause a little harm to you. °What can I do to lower my risk? °Safety °· Remove slippery rugs and loose objects on the floor. They increase your risk of tripping or slipping. °· Wear comfortable shoes that have a good grip on the sole. Do not wear high-heeled shoes. °· Always wear your seat belt properly when riding in a car. Use both the lap and shoulder belt, with the lap belt below your abdomen. Always practice safe driving. Do not ride on a motorcycle while pregnant. °Activity °· Avoid walking on wet or slippery floors. °· Do not participate in rough and violent activities or sports. °· Avoid high-risk situations and activities such as: °? Lifting heavy pots of boiling or hot liquids. °? Fixing electrical problems. °? Being near fires or starting fires. °General instructions °· Take over-the-counter and prescription medicines only as told by your  health care provider. °· Know your blood type and the father's blood type in case you develop vaginal bleeding or experience an injury for which a blood transfusion is needed. °· Spousal abuse can be a serious cause of trauma during pregnancy. If you are a victim of domestic violence or assault: °? Call your local emergency services (911 in the [place] °? Contact the National Domestic Violence Hotline for help and support. °When should I seek immediate medical care? °Get help right away if: °· You fall on your abdomen or experience any serious blow to your abdomen. °· You develop stiffness in your neck or pain after a fall or from other trauma. °· You develop a headache or vision problems after a fall or from other trauma. °· You do not feel the baby moving after a fall or trauma, or you feel that the baby is not moving as much as before the fall or trauma. °· You have been the victim of domestic violence or any other kind of physical attack. °· You have been in a car accident. °· You develop vaginal bleeding. °· You have fluid leaking from the vagina. °· You develop uterine contractions. Symptoms include pelvic cramping, pain, or serious low back pain. °· You become weak, faint, or have uncontrolled vomiting after trauma. °· You have a serious burn. This includes burns to the face, neck, hands, or genitals, or burns greater than the size of your palm anywhere else. °Summary °· Trauma is the most common cause of injury and death   in pregnant women and can also lead to injury or death of the baby.  Falls, automobile accidents, domestic violence or assault, and severe burns can injure you or your baby. Make sure to get medical help right away if you experience any of these during your pregnancy.  Take steps to prevent slips or falls in your home, such as avoiding slippery floors and removing loose rugs.  Always wear your seat belt properly when riding in a car. Practice safe driving. This information is not  intended to replace advice given to you by your health care provider. Make sure you discuss any questions you have with your health care provider. Document Released: 12/30/2004 Document Revised: 12/01/2016 Document Reviewed: 12/01/2016 Elsevier Interactive Patient Education  2019 [employer]  Back Pain in Pregnancy Back pain during pregnancy is common. Back pain may be caused by several factors that are related to changes during your pregnancy. Follow these instructions at home: Managing pain, stiffness, and swelling      If directed, for sudden (acute) back pain, put ice on the painful area. ? Put ice in a plastic bag. ? Place a towel between your skin and the bag. ? Leave the ice on for 20 minutes, 2-3 times per day.  If directed, apply heat to the affected area before you exercise. Use the heat source that your health care provider recommends, such as a moist heat pack or a heating pad. ? Place a towel between your skin and the heat source. ? Leave the heat on for 20-30 minutes. ? Remove the heat if your skin turns bright red. This is especially important if you are unable to feel pain, heat, or cold. You may have a greater risk of getting burned.  If directed, massage the affected area. Activity  Exercise as told by your health care provider. Gentle exercise is the best way to prevent or manage back pain.  Listen to your body when lifting. If lifting hurts, ask for help or bend your knees. This uses your leg muscles instead of your back muscles.  Squat down when picking up something from the floor. Do not bend over.  Only use bed rest for short periods as told by your health care provider. Bed rest should only be used for the most severe episodes of back pain. Standing, sitting, and lying down  Do not stand in one place for long periods of time.  Use good posture when sitting. Make sure your head rests over your shoulders and is not hanging forward. Use a pillow on your  lower back if necessary.  Try sleeping on your side, preferably the left side, with a pregnancy support pillow or 1-2 regular pillows between your legs. ? If you have back pain after a night's rest, your bed may be too soft. ? A firm mattress may provide more support for your back during pregnancy. General instructions  Do not wear high heels.  Eat a healthy diet. Try to gain weight within your health care provider's recommendations.  Use a maternity girdle, elastic sling, or back brace as told by your health care provider.  Take over-the-counter and prescription medicines only as told by your health care provider.  Work with a physical therapist or massage therapist to find ways to manage back pain. Acupuncture or massage therapy may be helpful.  Keep all follow-up visits as told by your health care provider. This is important. Contact a health care provider if:  Your back pain interferes with your daily  activities.  You have increasing pain in other parts of your body. Get help right away if:  You develop numbness, tingling, weakness, or problems with the use of your arms or legs.  You develop severe back pain that is not controlled with medicine.  You have a change in bowel or bladder control.  You develop shortness of breath, dizziness, or you faint.  You develop nausea, vomiting, or sweating.  You have back pain that is a rhythmic, cramping pain similar to labor pains. Labor pain is usually 1-2 minutes apart, lasts for about 1 minute, and involves a bearing down feeling or pressure in your pelvis.  You have back pain and your water breaks or you have vaginal bleeding.  You have back pain or numbness that travels down your leg.  Your back pain developed after you fell.  You develop pain on one side of your back.  You see blood in your urine.  You develop skin blisters in the area of your back pain. Summary  Back pain may be caused by several factors that are  related to changes during your pregnancy.  Follow instructions as told by your health care provider for managing pain, stiffness, and swelling.  Exercise as told by your health care provider. Gentle exercise is the best way to prevent or manage back pain.  Take over-the-counter and prescription medicines only as told by your health care provider.  Keep all follow-up visits as told by your health care provider. This is important. This information is not intended to replace advice given to you by your health care provider. Make sure you discuss any questions you have with your health care provider. Document Released: 03/02/2006 Document Revised: 05/10/2018 Document Reviewed: 05/10/2018 Elsevier Interactive Patient Education  2019 [employer].

## 2019-04-09 ENCOUNTER — Other Ambulatory Visit: Payer: Self-pay

## 2019-04-09 ENCOUNTER — Ambulatory Visit (INDEPENDENT_AMBULATORY_CARE_PROVIDER_SITE_OTHER): Payer: 59 | Admitting: Physical Therapy

## 2019-04-09 DIAGNOSIS — M6281 Muscle weakness (generalized): Secondary | ICD-10-CM | POA: Diagnosis not present

## 2019-04-09 DIAGNOSIS — M797 Fibromyalgia: Secondary | ICD-10-CM

## 2019-04-09 DIAGNOSIS — R29898 Other symptoms and signs involving the musculoskeletal system: Secondary | ICD-10-CM | POA: Diagnosis not present
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
## 2019-04-13 ENCOUNTER — Encounter: Payer: Self-pay | Admitting: Physical Therapy

## 2019-04-13 ENCOUNTER — Ambulatory Visit (INDEPENDENT_AMBULATORY_CARE_PROVIDER_SITE_OTHER): Payer: 59 | Admitting: Physical Therapy

## 2019-04-13 ENCOUNTER — Other Ambulatory Visit: Payer: Self-pay

## 2019-04-13 DIAGNOSIS — M797 Fibromyalgia: Secondary | ICD-10-CM | POA: Diagnosis not present

## 2019-04-13 DIAGNOSIS — M6281 Muscle weakness (generalized): Secondary | ICD-10-CM

## 2019-04-13 DIAGNOSIS — R29898 Other symptoms and signs involving the musculoskeletal system: Secondary | ICD-10-CM | POA: Diagnosis not present

## 2019-04-13 NOTE — Therapy (Signed)
[place], [place], [address] Phone: [phone]   Fax:  [phone]  Physical Therapy Treatment  Patient Details  Name: Kaylee Anderson MRN: [id number] Date of Birth: 1986/06/20 Referring Provider (PT): Nelson Chimes, [place]    Encounter Date: 04/13/2019  PT End of Session - 04/13/19 6962    Visit Number  3    Number of Visits  12    Date for PT Re-Evaluation  05/18/19    PT Start Time  0903    PT Stop Time  0944    PT Time Calculation (min)  41 min       Past Medical History:  Diagnosis Date  . Abnormal Pap smear of cervix 2008   cryo  . Dysthymia 06/21/2017  . Fibromyalgia   . HGSIL on cytologic smear of cervix    s/p colposcopy, most recent pap normal  . LGSIL on Pap smear of cervix   . Multiple lipomas   . PCOS (polycystic ovarian syndrome)   . Rheumatoid arthritis ([place]) 06/21/2017  . Trichomonal cervicitis     Past Surgical History:  Procedure Laterality Date  . CHOLECYSTECTOMY    . COLPOSCOPY    . KNEE ARTHROSCOPY Left 2002  . LIPOMA EXCISION    . PARTIAL GASTRECTOMY      There were no vitals filed for this visit.  Subjective Assessment - 04/13/19 0907    Subjective  Pt returned to work and complains of pain with transitions from sitting.  Overall, she is feeling "more open" since doing the exercises.      Patient Stated Goals  decrease pain and get to the end of the pregnancy with less pain     Currently in Pain?  Yes    Pain Score  4     Pain Location  Generalized    Pain Descriptors / Indicators  Aching    Aggravating Factors   worse in the AM     Pain Relieving Factors  stretching, walking          [facility] PT Assessment - 04/13/19 0001      Assessment   Medical Diagnosis  Fibromyalgia; chronic fatigue     Referring Provider (PT)  Nelson Chimes, PA-C     Onset Date/Surgical Date  02/18/19    Hand Dominance  Right    Next MD Visit  PRN     Prior Therapy   chiropractic; massage; accupuncture        [facility] Adult PT Treatment/Exercise - 04/13/19 0001      Lumbar Exercises: Stretches   Hip Flexor Stretch  Right;Left;2 reps;20 seconds   seated, with arm overhead   Quad Stretch  Right;Left;1 rep;20 seconds   seated, foot under chair   Piriformis Stretch  Right;Left;20 seconds   seated   Gastroc Stretch  Right;Left;2 reps;20 seconds    Other Lumbar Stretch Exercise  seated adductor stretch x 20 sec x 1 rep      Lumbar Exercises: Aerobic   Nustep  L5: 6 min (arms/legs)      Lumbar Exercises: Seated   Sit to Stand  --   8 reps, with staggered stance, core engaged.    Other Seated Lumbar Exercises  sitting on red pball: seated marching with 3 part core x 10 steps;  small posterior lean with alternating arms to 90 deg flexion x 5 reps, 2 sets.     Other Seated Lumbar Exercises  sitting on red pball: pelvic tilits  front/ back and side to side, circles CW/ CCW.       Lumbar Exercises: Quadruped   [place] Horse  5 reps    Opposite Arm/Leg Raise  Right arm/Left leg;Left arm/Right leg;5 reps   toe on table     Moist Heat Therapy   Number Minutes Moist Heat  --   pt declined; will use heat at home.        PT Long Term Goals - 04/06/19 1254      PT LONG TERM GOAL #1   Title  Decrease pain with functional activities to no more than 4/10 to 5/10 pain level 05/18/2019    Time  6    Period  Weeks    Status  New      PT LONG TERM GOAL #2   Title  Improve core and LE strength allowing patient to perform 20-30 min of exercise/walking with minimal to no increase in symptoms 05/18/2019    Time  6    Period  Weeks    Status  New      PT LONG TERM GOAL #3   Title  Independent in HEP with appropriate progression of exercises for pregnancy 05/18/2019    Time  6    Period  Weeks    Status  New            Plan - 04/13/19 0940    Clinical Impression Statement  Pt reporting reduction in symptoms with implimentation of stretches daily.  Pt  reported reduction of pain with transitions with use of engaging core and staggering stance.   She tolerated all exercises well and is progressing well towards all goals.    Rehab Potential  Good    PT Frequency  2x / week    PT Duration  6 weeks    PT Treatment/Interventions  Patient/family education;ADLs/Self Care Home Management;Cryotherapy;Moist Heat;Dry needling;Manual techniques;Electrical Stimulation;Iontophoresis 4mg /ml Dexamethasone;Ultrasound;Therapeutic activities;Therapeutic exercise;Neuromuscular re-education    PT Next Visit Plan   progress HEP and core stabilization.     Consulted and Agree with Plan of Care  Patient       Patient will benefit from skilled therapeutic intervention in order to improve the following deficits and impairments:  Postural dysfunction, Improper body mechanics, Pain, Increased fascial restricitons, Increased muscle spasms, Hypermobility, Decreased range of motion, Decreased mobility, Decreased activity tolerance  Visit Diagnosis: Fibromyalgia  Other symptoms and signs involving the musculoskeletal system  Muscle weakness (generalized)     Problem List Patient Active Problem List   Diagnosis Date Noted  . Supervision of normal pregnancy 01/15/2019  . History of postpartum hemorrhage, currently pregnant 01/15/2019  . Chronic fatigue syndrome with fibromyalgia 10/09/2018  . Rheumatoid factor positive 10/09/2018  . Shellfish allergy 07/03/2018  . History of multiple allergies 07/03/2018  . History of food anaphylaxis 07/03/2018  . Ear itching 07/03/2018  . Depression/fibromyalgia/chronic fatigue syndrome 12/16/2017  . Overweight (BMI 25.0-29.9) 11/24/2017  . Polyarthralgia 11/24/2017  . Family history of breast cancer gene mutation in first degree relative 06/21/2017  . Family history of cancer 12/08/2015   Kerin Perna, PTA 04/13/19 9:58 AM  [facility] [place], [place], [address] Phone: [phone]   Fax:  [phone]  Name: Kaylee Anderson MRN: [id number] Date of Birth: [birth year]

## 2019-04-20 ENCOUNTER — Other Ambulatory Visit: Payer: Self-pay

## 2019-04-20 ENCOUNTER — Ambulatory Visit (INDEPENDENT_AMBULATORY_CARE_PROVIDER_SITE_OTHER): Payer: 59 | Admitting: Physical Therapy

## 2019-04-20 DIAGNOSIS — M797 Fibromyalgia: Secondary | ICD-10-CM | POA: Diagnosis not present

## 2019-04-20 DIAGNOSIS — M6281 Muscle weakness (generalized): Secondary | ICD-10-CM | POA: Diagnosis not present

## 2019-04-20 DIAGNOSIS — R29898 Other symptoms and signs involving the musculoskeletal system: Secondary | ICD-10-CM

## 2019-04-20 NOTE — Patient Instructions (Addendum)
One-Leg Balance    Extend right leg in front. Heel can rest on floor or be lifted. Balance for _a few__ breaths. Repeat with other leg. Repeat _5__ times, alternating legs. Do _1__ times per day.  Resistive Band Rowing   With resistive band anchored in door, grasp both ends. Keeping elbows bent, pull back, squeezing shoulder blades together. Hold _3-5___ seconds. Repeat _10___ times. Do __1__ sessions per day.   Strengthening: Resisted Extension   Hold tubing with both hands, arms forward. Pull arms back, elbow straight. Repeat _10___ times per set. Do __1-2__ sets per session. Do _1___ sessions per day.  Scapula Adduction With Pectorals, Mid-Range   Stand in doorframe with palms against frame and arms at 90. Lean forward and squeeze shoulder blades. Hold _15__ seconds. Repeat _2__ times per session.  Piriformis Stretch    Lying on back, pull right knee toward opposite shoulder. Hold __15-20__ seconds. Repeat _2___ times. Do _2___ sessions per day.  [facility] [place], [place] [address]  [phone] (office) [phone] (fax)

## 2019-04-20 NOTE — Therapy (Addendum)
[facility] [place], [place], [address] Phone: [phone]   Fax:  [phone]  Physical Therapy Treatment  Patient Details  Name: Kaylee Anderson MRN: [id number] Date of Birth: 22-Jul-1986 Referring Provider (PT): Nelson Chimes, [place]    Encounter Date: 04/20/2019  PT End of Session - 04/20/19 1038    Visit Number  4    Number of Visits  12    Date for PT Re-Evaluation  05/18/19    PT Start Time  6226    PT Stop Time  1120    PT Time Calculation (min)  48 min    Activity Tolerance  Patient tolerated treatment well    Behavior During Therapy  [facility] for tasks assessed/performed       Past Medical History:  Diagnosis Date  . Abnormal Pap smear of cervix 2008   cryo  . Dysthymia 06/21/2017  . Fibromyalgia   . HGSIL on cytologic smear of cervix    s/p colposcopy, most recent pap normal  . LGSIL on Pap smear of cervix   . Multiple lipomas   . PCOS (polycystic ovarian syndrome)   . Rheumatoid arthritis ([place]) 06/21/2017  . Trichomonal cervicitis     Past Surgical History:  Procedure Laterality Date  . CHOLECYSTECTOMY    . COLPOSCOPY    . KNEE ARTHROSCOPY Left 2002  . LIPOMA EXCISION    . PARTIAL GASTRECTOMY      There were no vitals filed for this visit.  Subjective Assessment - 04/20/19 1346    Subjective  Pt reports she has been performing the stretches throughout the day and this has helped decrease her overall pain level.  She mostly notices pain in her groin/ low back at end of long shift; relieved with stretches and rest.      Pertinent History  Fibromyalgia; chronic fatigue     Currently in Pain?  Yes    Pain Score  4    pt describes this as her baseline   Pain Location  Generalized    Pain Descriptors / Indicators  Dull    Aggravating Factors   worse in AM    Pain Relieving Factors  stretching, walking          [facility] PT Assessment - 04/20/19 0001      Assessment   Medical Diagnosis   Fibromyalgia; chronic fatigue     Referring Provider (PT)  Nelson Chimes, PA-C     Onset Date/Surgical Date  02/18/19    Hand Dominance  Right    Next MD Visit  PRN     Prior Therapy  chiropractic; massage; accupuncture        [facility] Adult PT Treatment/Exercise - 04/20/19 0001      Lumbar Exercises: Stretches   Lower Trunk Rotation  5 reps;10 seconds    Hip Flexor Stretch  Right;Left;2 reps;20 seconds   seated, with arm overhead   Quad Stretch  Right;Left;1 rep;20 seconds   seated, foot under chair   Piriformis Stretch  Right;Left;20 seconds;3 reps   travell   Other Lumbar Stretch Exercise  doorway stretch low, mid-level x 15 sec each       Lumbar Exercises: Aerobic   Nustep  L5: 5.5 min (arms/legs)      Lumbar Exercises: Standing   Row  Both;10 reps;Theraband    Theraband Level (Row)  Level 2 (Red)    Shoulder Extension  Both;10 reps    Theraband Level (Shoulder Extension)  Level 2 (  Red)    Other Standing Lumbar Exercises  warrior 2 pose x 15 sec, then 15 small pulses - each side.     Other Standing Lumbar Exercises  Standing hip abdct x 10 each leg, hip ext x 10 each leg.       Lumbar Exercises: Seated   Other Seated Lumbar Exercises  sitting on swiss ball- single leg balance with arms abdct 90 deg  x 5 sec each side.     Other Seated Lumbar Exercises  ball roll outs (modified childs pose x 15 sec x 2       Lumbar Exercises: Quadruped   Madcat/Old Horse  5 reps    Madcat/Old Horse Limitations  wag the tail (sideways curve to spine) x 5     Opposite Arm/Leg Raise  Right arm/Left leg;Left arm/Right leg;5 reps   toe on table   Other Quadruped Lumbar Exercises  childs pose with lateral trunk flexion x 15 sec each; thread the needle x 5 sec x 3 reps each side.              PT Education - 04/20/19 1057    Education Details  Updated HEP    Person(s) Educated  Patient    Methods  Explanation;Handout;Demonstration;Verbal cues    Comprehension  Verbalized  understanding;Returned demonstration          PT Long Term Goals - 04/20/19 1133      PT LONG TERM GOAL #1   Title  Decrease pain with functional activities to no more than 4/10 to 5/10 pain level 05/18/2019    Time  6    Period  Weeks    Status  Achieved      PT LONG TERM GOAL #2   Title  Improve core and LE strength allowing patient to perform 20-30 min of exercise/walking with minimal to no increase in symptoms 05/18/2019    Time  6    Period  Weeks    Status  Partially Met      PT LONG TERM GOAL #3   Title  Independent in HEP with appropriate progression of exercises for pregnancy 05/18/2019    Time  6    Period  Weeks    Status  On-going            Plan - 04/20/19 1342    Clinical Impression Statement  Pt has had positive response of reduction in overall pain since utilizing consistent stretches of HEP.  She tolerated new strengthening / stretching exercises today, without any increase in symptoms.  Pt has partially met her goals and requests to hold therapy while she continues to work on her HEP.      Rehab Potential  Good    PT Frequency  2x / week    PT Duration  6 weeks    PT Treatment/Interventions  Patient/family education;ADLs/Self Care Home Management;Cryotherapy;Moist Heat;Dry needling;Manual techniques;Electrical Stimulation;Iontophoresis 58m/ml Dexamethasone;Ultrasound;Therapeutic activities;Therapeutic exercise;Neuromuscular re-education    PT Next Visit Plan  spoke with supervising PT; will hold therapy per pt request for 30 days. If pt doesn't return by 6/15, will d/c.     Consulted and Agree with Plan of Care  Patient       Patient will benefit from skilled therapeutic intervention in order to improve the following deficits and impairments:  Postural dysfunction, Improper body mechanics, Pain, Increased fascial restricitons, Increased muscle spasms, Hypermobility, Decreased range of motion, Decreased mobility, Decreased activity tolerance  Visit  Diagnosis: Fibromyalgia  Other symptoms and  signs involving the musculoskeletal system  Muscle weakness (generalized)     Problem List Patient Active Problem List   Diagnosis Date Noted  . Supervision of normal pregnancy 01/15/2019  . History of postpartum hemorrhage, currently pregnant 01/15/2019  . Chronic fatigue syndrome with fibromyalgia 10/09/2018  . Rheumatoid factor positive 10/09/2018  . Shellfish allergy 07/03/2018  . History of multiple allergies 07/03/2018  . History of food anaphylaxis 07/03/2018  . Ear itching 07/03/2018  . Depression/fibromyalgia/chronic fatigue syndrome 12/16/2017  . Overweight (BMI 25.0-29.9) 11/24/2017  . Polyarthralgia 11/24/2017  . Family history of breast cancer gene mutation in first degree relative 06/21/2017  . Family history of cancer 12/08/2015   Kerin Perna, PTA 04/20/19 1:52 PM  [facility] [place], [place], [address] Phone: [phone]   Fax:  [phone]  Name: Kaylee Anderson MRN: [id number] Date of Birth: [birth year]  PHYSICAL THERAPY DISCHARGE SUMMARY  Visits from Start of Care: 4  Current functional level related to goals / functional outcomes: See last progress note for discharge status.   Remaining deficits: Unknown - doing well at last visit - pleased with progress   Education / Equipment: HEP  Plan: Patient agrees to discharge.  Patient goals were met. Patient is being discharged due to meeting the stated rehab goals.  ?????    Celyn P. Helene Kelp PT, MPH 06/07/19 9:53 AM

## 2019-05-31 ENCOUNTER — Ambulatory Visit (HOSPITAL_COMMUNITY): Payer: 59

## 2019-06-01 ENCOUNTER — Other Ambulatory Visit: Payer: Self-pay

## 2019-06-01 ENCOUNTER — Telehealth (INDEPENDENT_AMBULATORY_CARE_PROVIDER_SITE_OTHER): Payer: 59

## 2019-06-01 VITALS — BP 113/69 | Wt 209.0 lb

## 2019-06-01 DIAGNOSIS — Z3483 Encounter for supervision of other normal pregnancy, third trimester: Secondary | ICD-10-CM

## 2019-06-01 DIAGNOSIS — Z23 Encounter for immunization: Secondary | ICD-10-CM

## 2019-06-01 DIAGNOSIS — Z3A28 28 weeks gestation of pregnancy: Secondary | ICD-10-CM

## 2019-06-01 DIAGNOSIS — Z3480 Encounter for supervision of other normal pregnancy, unspecified trimester: Secondary | ICD-10-CM

## 2019-06-01 NOTE — Progress Notes (Signed)
   TELEHEALTH VIRTUAL OBSTETRICS VISIT ENCOUNTER NOTE  I connected with Kaylee Anderson on 06/01/19 at  8:15 AM EDT by MyChart Virtual Visit at home and verified that I am speaking with the correct person using two identifiers.   I discussed the limitations, risks, security and privacy concerns of performing an evaluation and management service by telephone and the availability of in person appointments. I also discussed with the patient that there may be a patient responsible charge related to this service. The patient expressed understanding and agreed to proceed.  Subjective:  Kaylee Anderson is a 33 y.o. G2P1001 at [redacted]w[redacted]d being followed for ongoing prenatal care.  She is currently monitored for the following issues for this low-risk pregnancy and has Family history of cancer; Family history of breast cancer gene mutation in first degree relative; Overweight (BMI 25.0-29.9); Polyarthralgia; Depression/fibromyalgia/chronic fatigue syndrome; Shellfish allergy; History of multiple allergies; History of food anaphylaxis; Ear itching; Chronic fatigue syndrome with fibromyalgia; Rheumatoid factor positive; Supervision of normal pregnancy; and History of postpartum hemorrhage, currently pregnant on their problem list.  Patient reports heartburn. Reports fetal movement. Denies any contractions, bleeding or leaking of fluid.   The following portions of the patient's history were reviewed and updated as appropriate: allergies, current medications, past family history, past medical history, past social history, past surgical history and problem list.   Objective:   General:  Alert, oriented and cooperative.   Mental Status: Normal mood and affect perceived. Normal judgment and thought content.  Rest of physical exam deferred due to type of encounter  Assessment and Plan:  Pregnancy: G2P1001 at [redacted]w[redacted]d 1. Supervision of other normal pregnancy, antepartum -BP 113/69 -Tdap today -Comfort measures for  heartburn reviewed with patient -U/S for growth scheduled 6/30 -Reviewed contraception options at length. Patient unsure which method but will talk with husband to decide. -Anticipatory guidance of next visits reviewed with patient.  - 2Hr GTT w/ 1 Hr Carpenter 75 g - CBC - HIV antibody (with reflex) - RPR  Preterm labor symptoms and general obstetric precautions including but not limited to vaginal bleeding, contractions, leaking of fluid and fetal movement were reviewed in detail with the patient.  I discussed the assessment and treatment plan with the patient. The patient was provided an opportunity to ask questions and all were answered. The patient agreed with the plan and demonstrated an understanding of the instructions. The patient was advised to call back or seek an in-person office evaluation/go to [facility] at [facility] for any urgent or concerning symptoms. Please refer to After Visit Summary for other counseling recommendations.   I provided 15 minutes of non-face-to-face time during this encounter.  Return in about 4 weeks (around 06/29/2019) for Return OB visit.  Future Appointments  Date Time Provider [place]  06/05/2019  4:00 PM WH-MFC [place] 3 WH-MFCUS MFC-US    Bartow Zylstra M Karen Kinnard, [place] for [employer], [place]

## 2019-06-01 NOTE — Progress Notes (Signed)
RN informed CNM that patient vomited glucola. Called patient and she states she has had gastric sleeve surgery and the volume was too much too quickly. It caused her to vomit and have an episode of dumping. Discussed options with patient for GTT testing including repeating 2hr with nausea medication, jelly beans or monitoring CBGs. Patient states she will be unable to eat enough jelly beans and wants to do CBG testing. States she has a monitor at home with strips and supplies. Discussed schedule for testing CBGs and will schedule for a virtual visit in 2 weeks to review results. Patient verbalized understanding.   Wende Mott, CNM 06/01/19 10:41 AM

## 2019-06-01 NOTE — Patient Instructions (Signed)
AREA PEDIATRIC/FAMILY PRACTICE PHYSICIANS  [place] ([address]) . [facility] o Chambliss, MD; Eniola, MD; Hale, MD; Hensel, MD; McDiarmid, MD; McIntyer, MD; Neal, MD; Walden, MD o [address] [place], [place] [address] o [phone] o Mon-Fri 8:30-12:30, 1:30-5:00 o Providers come to see babies at [facility] o Accepting Medicaid . [facility] o Limited providers who accept newborns: Koirala, MD; Morrow, MD; Wolters, MD o [address], [place], [place] [address] o [phone] o Mon-Fri 8:00-5:30 o Babies seen by providers at [facility] o Does NOT accept Medicaid o Please call early in hospitalization for appointment (limited availability)  . [facility] o Mulberry, MD o [address] [place], [place] [address] o [phone] o Mon, Tue, Thur, Fri 8:30-5:00, Wed 10:00-7:00 (closed 1-2pm) o Babies seen by [facility] providers o Accepting Medicaid . [facility] o Rubin, MD o [address], [place], [place] [address] o [phone] o Mon-Fri 8:30-5:00, Sat 8:30-12:00 o Provider comes to see babies at [facility] o Accepting Medicaid o Must have been referred from current patients or contacted office prior to delivery . [facility] ([facility]) o Brown, MD; Chandler, MD; Ettefagh, MD; Grant, MD; Lester, MD; McCormick, MD; McQueen, MD; Prose, MD; Simha, MD; Stanley, MD; Stryffeler, NP; Tebben, NP o [address], [place], [place] [address] o [phone] o Mon, Tue, Thur, Fri 8:30-5:30, Wed 9:30-5:30, Sat 8:30-12:30 o Babies seen by [facility] providers o Accepting Medicaid o Only accepting infants of first-time parents or siblings of current patients o Hospital discharge coordinator will make follow-up appointment . Jack Amos o [address],  [place], [place]  [address] o [phone]   Fax - [phone] . [facility] o [address], [place], [place]  [address] o Phone - [phone]   Fax - [phone] . Shilpa Gosrani o [address], [place], [place]  [address] o [phone]  [place] ([address]) . [facility] o Bates, MD; [place], MD; Cooper, Cox, MD; MD; Davis, MD; Dovico, MD; Ettefaugh, MD; Little, MD; Lowe, MD; Keiffer, MD; Melvin, MD; Sumner, MD; Williams, MD o [address], [place], [place] [address] o [phone] o Mon-Fri 8:30-5:00 (extended evenings Mon-Thur as needed), Sat-Sun 10:00-1:00 o Providers come to see babies at [facility] o Accepting Medicaid for families of first-time babies and families with all children in the household age 3 and under. Must register with office prior to making appointment (M-F only). . [facility] o Henson, NP; Knapp, MD; Lalonde, MD; Tysinger, PA o [address] [place], [place] [address] o [phone] o Mon-Fri 8:00-5:00 o Babies seen by providers at [facility] o Does NOT accept Medicaid/Commercial Insurance Only . [facility] ([facility])  o Artis, MD; Barnes, MD; Bratton, MD; Coccaro, MD; Lockett Gardner, MD; Kramer, MD; Marshall, MD; Netherton, MD; Poleto, MD; Skinner, MD o [address] [place], [place] [address] o [phone] o Mon-Fri 8:30-5:30, Sat (Oct.-Mar.) 9:00-1:00 o Babies seen by providers at [facility] o Accepting Medicaid  [place] ([address]) . [facility] o Reid, MD; Warner, MD o [address], [place], [place] [address] o [phone] o Mon-Fri 8:30-5:00, Sat 8:30-12:00 o Providers come to see babies at [facility] o Does NOT accept Medicaid . [facility] o Becker, PA; Hagler, MD; Scifres, PA; Sun, MD; Swayne, MD o [address],  [place], [place] [address] o [phone] o Mon-Fri 8:00-5:00 o Babies seen by providers at [facility] o Does NOT accept Medicaid o Only accepting babies of parents who are patients o Please call early in hospitalization for appointment (limited availability) . [facility] o Clark, MD; Frye, MD; Kelleher, MD; Mack, NP; Miller, MD; O'Keller, MD; Patterson, NP; Pudlo, MD; Puzio, MD; Thomas, MD; Tucker, MD; Twiselton, MD o [address], [place], [place] [address] o [phone] o Mon-Fri 8:00-5:00, Sat 9:00-12:00 o Providers come to see babies at [facility] o Does NOT accept Medicaid  [place] ([address]) . [facility] o Limited providers accepting new patients: Brake, NP; Wharton, PA o [address], [place], [place] [address] o [phone] o Mon-Fri 8:00-5:00 o Babies seen by providers at [facility] o Does NOT accept Medicaid o Only accepting babies of parents who are patients o Please call early in hospitalization for appointment (limited availability) . [facility] o Gay, MD; Quinlan, MD o [address] [place], [place] [address] o [phone] (press [phone] to schedule appointment) o Mon-Fri 8:00-5:00 o Providers come to see babies at [facility] o Does NOT accept Medicaid . [facility] o Mazer, MD o [address] [place], [place] [address] o [phone] o Mon-Fri 8:30-5:00 (lunch 12:30-1:00), extended hours by appointment only Wed 5:00-6:30 o Babies seen by [facility] providers o Accepting Medicaid . [facility] o Banks, MD; Jordan, MD; Koberlein, MD o [address], [place], [place] [address] o [phone] o Mon-Fri 8:00-5:00 o Babies seen by [facility] providers o Does NOT accept Medicaid . [facility] o Parker, MD; Hunter, MD; Wallace, DO o [address] [place], [place]  [address] o [phone] o Mon-Fri 8:00-5:00 o Babies seen by [facility] providers o Does NOT accept Medicaid . [facility] o Brandon, PA; Brecken, PA; Christy, NP; Dees, MD; DeClaire, MD; DeWeese, MD; Hansen, NP; Mills, NP; Parrish, NP; Smoot, NP; Summer, MD; Vapne, MD o [address] [place], [place] [address] o [phone] o Mon-Fri 8:30-5:00, Sat 10:00-1:00 o Providers come to see babies at [facility] o Does NOT accept Medicaid o Free prenatal information session Tuesdays at 4:45pm . [facility] o Bouska, MD; Gordon, PA; Jeffery, PA; Weber, PA o [address] [place] [address] o [phone] o Mon-Fri 7:30-5:30 o Babies seen by [facility] providers . [facility] o [address], [place], [place]  [address] o [phone]   Fax - [phone]  [place] ([address]) . [facility] o Reese, MD o [address] [place], [place] [address] o [phone] o Mon-Thur 8:00-6:00 o Providers come to see babies at [facility] o Accepting Medicaid . [facility] o Anderson, NP; Badger, MD; Beal, PA; Spencer, PA o [address] [place], [place] [address] o [phone] o Mon-Thur 7:30-7:30, Fri 7:30-4:30 o Babies seen by [facility] providers o Accepting Medicaid . [facility] o Agbuya, MD; Klett, NP; Romgoolam, MD o [address], [place], [place] [address] o [phone] o Mon-Fri 8:30-5:00, Sat 8:30-12:00 o Providers come to see babies at [facility] o Accepting Medicaid o Must have "Meet & Greet" appointment at office prior to delivery . [facility] ([facility]) o McCord,   MD; Wallace, MD; Wood, MD o [address], [place], [place] [address] o [phone] o Mon-Wed 8:00-6:00, Thur-Fri 8:00-5:00, Sat 9:00-12:00 o Providers come to  see babies at [facility] o Does NOT accept Medicaid o Only accepting siblings of current patients . [facility]  o [address], [place], [place]  [address] o [phone]   Fax - [phone] . [facility] o [address], [place], [place]  [address] o [phone]   Fax - [phone]  [place] ([address] & [address]) . [facility] o Cirigliano, DO; Matthews, DO o [address] [place], [place] [address] o [phone] o Mon-Fri 7:00-5:00 o Babies seen by [facility] providers o Does NOT accept Medicaid . [facility] o Briscoe, MD; Howley, PA; Moreira, PA o [address], [place], [place] [address] o [phone] o Mon-Fri 8:00-5:00 o Babies seen by [facility] providers o Accepting Medicaid . [facility] o Boyd, MD; Church, PA; Jones, NP; Osborn, PA o [address], [place], [place] [address] o [phone] o Mon-Fri 8:00-5:00 o Babies seen by providers at [facility] o Accepting Medicaid  [place] ([address]) . [facility] [place] o Wendling, DO o [address] [place], [place] [address] o [phone] o Mon-Fri 8:00-5:00 o Babies seen by [facility] providers o Does NOT accept Medicaid o Limited availability, please call early in hospitalization to schedule follow-up . [facility] o Calderon, PA; Cummings, MD; Dillard, MD; Martin, PA; Olson, MD; VanDeven, PA o [address], [place], [place] [address] o [phone] o Mon-Fri 8:30-5:00, Sat 9:00-12:00 o Babies seen by providers at [facility] o Accepting Medicaid o Please register online then schedule online or call office o [email or web address] . [facility] ([facility] o Hunter, NP; Kumar, MD; Martin Rogers, PA o [address], [place], [place] [address] o [phone] o Mon-Fri 8:00-5:00 o Babies seen by providers at [facility] o Accepting Medicaid . [facility] ([facility] o Leflore, MD; Kristi Fleenor, NP; West, MD o [address], [place], [place] [address] o [phone] o Mon-Fri 8:00-5:30, Sat&Sun by appointment (phones open at 8:30) o Babies seen by [facility] providers o Accepting Medicaid o Must be a first-time baby or sibling of current patient . [facility] [place]  o [address], [place], [place]  [address] o [phone]   Fax - [phone]  [place] ([address]) . [facility] o Brown, PA; Cowen, PA; Rice, MD; Helton, PA; Spry, MD o [address] [place], [place] [address] o [phone] o Mon-Thur 8:00-7:00, Fri 8:00-5:00, Sat 8:00-12:00, Sun 9:00-12:00 o Babies seen by [facility] providers o Accepting Medicaid . [facility] o Coe-Goins, MD; Marshall, MD; Pierre-Louis, MD o [address], [place], [place] [address] o [phone] o Mon-Thur 8:00-5:00 o Babies seen by providers at [facility] o Accepting Medicaid . [facility] [place] o Bratton, MD; Coe-Goins, MD; Hayes, MD; Lewis, MD; List, MD; Lott, MD; Marshall, MD; Moran, MD; O'Neal, MD; Pierre-Louis, MD; Pitonzo, MD; Scholer, MD; Spangle, MD o [address] [place] [address] [place],    [address] o [phone] o Mon-Fri 8:00-5:30, Sat (Oct.-Mar.) 9:00-1:00 o Babies seen by providers at [facility] o Accepting Medicaid o Must fill out new patient packet, available online at [email or web address] . [facility] ([facility]) o Friddle, NP; Harris, NP; Kelly, NP; Logan, MD;  Melvin, PA; Poth, MD; Ramadoss, MD; Stanton, NP o [address], [place], [place] [address] o [phone] o Mon-Thur 8:00-5:30, Fri 8:00-5:00 o Babies seen by providers at [facility] o Accepting Medicaid  [place] ([address]) . [facility] o [place], PA; Bullitt, MD; Pickard, MD; Tapia, PA o [address], [place], [place] [address] o [phone] o Mon-Fri 8:00-5:00 o Babies seen by providers at [facility] o Accepting Medicaid   [place] ([address]) . [facility] [place] o Masneri, DO; Meyers, MD; Nelson, PA o [address], [place], [place] [address] o [phone] o Mon-Fri 8:00-5:00 o Babies seen by providers at [facility] o Does NOT accept Medicaid o Limited appointment availability, please call early in hospitalization  . [facility] [place] o Kunedd, DO; McGowen, MD o [address], [place], [place] [address] o [phone] o Mon-Fri 8:00-5:00 o Babies seen by [facility] providers o Does NOT accept Medicaid . [facility] [place] o Cameron, MD; MacDonald, MD; Michaels, PA; Nayak, MD o [address] [place] [address], [place], [place] [address] o [phone] o Mon-Fri 8:00-5:00 o After hours clinic ([address] [place], [place] [address]) [phone] Mon-Fri 5:00-8:00, Sat 12:00-6:00, Sun 10:00-4:00 o Babies seen by [facility] providers o Accepting Medicaid . [facility] [place] o [address], [place], [place]  [address] o [phone]   Fax - [phone]  [place] ([address]) . [facility] [place] [facility] o Andy, MD o [address], [place], [place] [address] o [phone] o Mon-Fri 8:00-5:00 o Babies seen by [facility] providers o Does NOT accept Medicaid . [facility] [place] ([facility] [place]) o Eksir, MD o [address], [place], [place]  [address] o [phone] o Mon-Thur 8:00-7:00, Fri 8:00-5:00, Sat 8:00-12:00 o Babies seen by providers at [facility] o Accepting Medicaid - but does not have vaccinations in office (must be received elsewhere) o Limited availability, please call early in hospitalization  [place] ([address]) . [facility]  o Charlene Flemming, MD o [address], [place] [address] o [phone]  Fax [phone]   

## 2019-06-02 MED ORDER — FERROUS SULFATE 325 (65 FE) MG PO TABS: 325.0000 mg | ORAL_TABLET | Freq: Every day | ORAL | 1 refills | Status: DC

## 2019-06-02 NOTE — Addendum Note (Signed)
Addended by: Wende Mott on: 06/02/2019 09:37 AM   Modules accepted: Orders

## 2019-06-04 LAB — CBC
HCT: 30.4 % — ABNORMAL LOW (ref 35.0–45.0)
Hemoglobin: 10 g/dL — ABNORMAL LOW (ref 11.7–15.5)
MCH: 31.2 pg (ref 27.0–33.0)
MCHC: 32.9 g/dL (ref 32.0–36.0)
MCV: 94.7 fL (ref 80.0–100.0)
MPV: 9.2 fL (ref 7.5–12.5)
Platelets: 343 10*3/uL (ref 140–400)
RBC: 3.21 10*6/uL — ABNORMAL LOW (ref 3.80–5.10)
RDW: 11.6 % (ref 11.0–15.0)
WBC: 8.2 10*3/uL (ref 3.8–10.8)

## 2019-06-04 LAB — RPR: RPR Ser Ql: NONREACTIVE

## 2019-06-04 LAB — HIV ANTIBODY (ROUTINE TESTING W REFLEX): HIV 1&2 Ab, 4th Generation: NONREACTIVE

## 2019-06-05 ENCOUNTER — Other Ambulatory Visit (HOSPITAL_COMMUNITY): Payer: Self-pay | Admitting: *Deleted

## 2019-06-05 ENCOUNTER — Other Ambulatory Visit: Payer: Self-pay

## 2019-06-05 ENCOUNTER — Ambulatory Visit (HOSPITAL_COMMUNITY)
Admission: RE | Admit: 2019-06-05 | Discharge: 2019-06-05 | Disposition: A | Discharge status: Home / Self Care | Payer: 59 | Source: Ambulatory Visit | Attending: Obstetrics & Gynecology | Admitting: Obstetrics & Gynecology

## 2019-06-05 DIAGNOSIS — Z3A29 29 weeks gestation of pregnancy: Secondary | ICD-10-CM | POA: Diagnosis not present

## 2019-06-05 DIAGNOSIS — Z362 Encounter for other antenatal screening follow-up: Secondary | ICD-10-CM

## 2019-06-05 DIAGNOSIS — O2693 Pregnancy related conditions, unspecified, third trimester: Secondary | ICD-10-CM | POA: Diagnosis not present

## 2019-06-05 DIAGNOSIS — Z3480 Encounter for supervision of other normal pregnancy, unspecified trimester: Secondary | ICD-10-CM | POA: Insufficient documentation

## 2019-06-05 DIAGNOSIS — Z3689 Encounter for other specified antenatal screening: Secondary | ICD-10-CM

## 2019-06-05 DIAGNOSIS — O99843 Bariatric surgery status complicating pregnancy, third trimester: Secondary | ICD-10-CM | POA: Diagnosis not present

## 2019-06-15 ENCOUNTER — Telehealth (INDEPENDENT_AMBULATORY_CARE_PROVIDER_SITE_OTHER): Payer: Self-pay

## 2019-06-15 VITALS — BP 108/66

## 2019-06-15 DIAGNOSIS — Z3483 Encounter for supervision of other normal pregnancy, third trimester: Secondary | ICD-10-CM

## 2019-06-15 DIAGNOSIS — Z3A30 30 weeks gestation of pregnancy: Secondary | ICD-10-CM

## 2019-06-15 DIAGNOSIS — Z3480 Encounter for supervision of other normal pregnancy, unspecified trimester: Secondary | ICD-10-CM

## 2019-06-15 NOTE — Progress Notes (Signed)
   TELEHEALTH VIRTUAL OBSTETRICS VISIT ENCOUNTER NOTE  I connected with Kaylee Anderson on 06/15/19 at  9:30 AM EDT by MyChart Virtual Visit at home and verified that I am speaking with the correct person using two identifiers.   I discussed the limitations, risks, security and privacy concerns of performing an evaluation and management service by telephone and the availability of in person appointments. I also discussed with the patient that there may be a patient responsible charge related to this service. The patient expressed understanding and agreed to proceed.  Subjective:  Kaylee Anderson is a 33 y.o. G2P1001 at [redacted]w[redacted]d being followed for ongoing prenatal care.  She is currently monitored for the following issues for this low-risk pregnancy and has Family history of cancer; Family history of breast cancer gene mutation in first degree relative; Overweight (BMI 25.0-29.9); Polyarthralgia; Depression/fibromyalgia/chronic fatigue syndrome; Shellfish allergy; History of multiple allergies; History of food anaphylaxis; Ear itching; Chronic fatigue syndrome with fibromyalgia; Rheumatoid factor positive; Supervision of normal pregnancy; and History of postpartum hemorrhage, currently pregnant on their problem list.  Patient reports no complaints. Reports fetal movement. Denies any contractions, bleeding or leaking of fluid.   The following portions of the patient's history were reviewed and updated as appropriate: allergies, current medications, past family history, past medical history, past social history, past surgical history and problem list.   Objective:   General:  Alert, oriented and cooperative.   Mental Status: Normal mood and affect perceived. Normal judgment and thought content.  Rest of physical exam deferred due to type of encounter  Assessment and Plan:  Pregnancy: G2P1001 at [redacted]w[redacted]d 1. Supervision of other normal pregnancy, antepartum -Patient here to review 2 weeks of CBG monitoring  because she was unable to complete GTT. See CBG log in MyChart message. -All CBG values within normal range. All fasting less than 95 and all PP less than 120. Commended patient on completion of log and normal CBGs.  -Patient to keep appointments as scheduled.   Preterm labor symptoms and general obstetric precautions including but not limited to vaginal bleeding, contractions, leaking of fluid and fetal movement were reviewed in detail with the patient.  I discussed the assessment and treatment plan with the patient. The patient was provided an opportunity to ask questions and all were answered. The patient agreed with the plan and demonstrated an understanding of the instructions. The patient was advised to call back or seek an in-person office evaluation/go to [facility] at [facility] for any urgent or concerning symptoms. Please refer to After Visit Summary for other counseling recommendations.   I provided 10 minutes of non-face-to-face time during this encounter.  Return in about 2 weeks (around 06/29/2019) for Return OB visit.  Future Appointments  Date Time Provider [place]  06/29/2019  9:00 AM Micheline Rough [facility]  07/06/2019  8:00 AM [place] 3 [facility]  07/31/2019  8:15 AM Lajean Manes, [place], [place] for [employer], [place]

## 2019-06-27 ENCOUNTER — Telehealth: Payer: Self-pay | Admitting: *Deleted

## 2019-06-27 NOTE — Telephone Encounter (Signed)
Left a message for patient to update the office with insurance information.

## 2019-06-29 ENCOUNTER — Other Ambulatory Visit: Payer: Self-pay

## 2019-06-29 ENCOUNTER — Telehealth (INDEPENDENT_AMBULATORY_CARE_PROVIDER_SITE_OTHER): Payer: BC Managed Care – PPO | Admitting: Certified Nurse Midwife

## 2019-06-29 DIAGNOSIS — Z3A32 32 weeks gestation of pregnancy: Secondary | ICD-10-CM

## 2019-06-29 DIAGNOSIS — O99019 Anemia complicating pregnancy, unspecified trimester: Secondary | ICD-10-CM | POA: Insufficient documentation

## 2019-06-29 DIAGNOSIS — Z3480 Encounter for supervision of other normal pregnancy, unspecified trimester: Secondary | ICD-10-CM

## 2019-06-29 DIAGNOSIS — O99013 Anemia complicating pregnancy, third trimester: Secondary | ICD-10-CM

## 2019-06-29 NOTE — Progress Notes (Signed)
[place] VIRTUAL VIDEO VISIT ENCOUNTER NOTE  Provider location: [facility] at [place]   I connected with Kaylee [facility] on 06/29/19 at  9:00 AM EDT by MyChart Video Encounter at home and verified that I am speaking with the correct person using two identifiers.   I discussed the limitations, risks, security and privacy concerns of performing an evaluation and management service virtually and the availability of in person appointments. I also discussed with the patient that there may be a patient responsible charge related to this service. The patient expressed understanding and agreed to proceed. Subjective:  Kaylee [facility] is a 33 y.o. G2P1001 at [redacted]w[redacted]d being seen today for ongoing prenatal care.  She is currently monitored for the following issues for this low-risk pregnancy and has Family history of cancer; Family history of breast cancer gene mutation in first degree relative; Overweight (BMI 25.0-29.9); Polyarthralgia; Depression/fibromyalgia/chronic fatigue syndrome; Shellfish allergy; History of multiple allergies; History of food anaphylaxis; Ear itching; Chronic fatigue syndrome with fibromyalgia; Rheumatoid factor positive; Supervision of normal pregnancy; History of postpartum hemorrhage, currently pregnant; and Anemia during pregnancy on their problem list.  Patient reports no complaints.  Contractions: Irritability. Vag. Bleeding: None.  Movement: Present. Denies any leaking of fluid.   The following portions of the patient's history were reviewed and updated as appropriate: allergies, current medications, past family history, past medical history, past social history, past surgical history and problem list.   Objective:   Vitals:   06/29/19 0901  BP: 120/70  Pulse: 84  Weight: 214 lb (97.1 kg)    Fetal Status:     Movement: Present     General:  Alert, oriented and cooperative. Patient is in no acute distress.  Respiratory: Normal  respiratory effort, no problems with respiration noted  Mental Status: Normal mood and affect. Normal behavior. Normal judgment and thought content.  Remainder of physical exam deferred due to type of encounter  Imaging: [place] Mfm Ob Follow Up  Result Date: 06/06/2019 ----------------------------------------------------------------------  OBSTETRICS REPORT                       (Signed Final 06/06/2019 01:56 pm) ---------------------------------------------------------------------- Patient Info  ID #:       [id number]                          D.O.B.:  01/14/1986 (32 yrs)  Name:       Kaylee [facility]                   Visit Date: 06/05/2019 03:57 pm ---------------------------------------------------------------------- Performed By  Performed By:     Enriqueta Shutter           Ref. Address:     [address] [place]  [place], [place]  Attending:        Sander Nephew      Location:         [facility]                    MD                                       Fetal Care  Referred By:      Guss Bunde MD ---------------------------------------------------------------------- Orders   #  Description                          Code         Ordered By   1  [place] MFM OB FOLLOW UP                  84665.99     KELLY LEGGETT  ----------------------------------------------------------------------   #  Order #                    Accession #                 Episode #   1  [id number]                  [id number]                  [id number]  ---------------------------------------------------------------------- Indications   Low risk NIPS   Medical complication of pregnancy              O26.90   (polyarthraligia/fibromyalgia,cfs)   Pregnancy complicated by previous gastric      O99.842   bypass,  antepartum, second trimester   Encounter for fetal growth retardation         Z36.4   [redacted] weeks gestation of pregnancy                Z3A.29  ---------------------------------------------------------------------- Fetal Evaluation  Num Of Fetuses:         1  Fetal Heart Rate(bpm):  137  Cardiac Activity:       Observed  Presentation:           Cephalic  Placenta:               Posterior Fundal  Amniotic Fluid  AFI FV:      Within normal limits  AFI Sum(cm)     %Tile       Largest Pocket(cm)  23.83           96          7.58  RUQ(cm)       RLQ(cm)       LUQ(cm)        LLQ(cm)  7.58          4.13          5.11  7.02 ---------------------------------------------------------------------- Biometry  BPD:      78.4  mm     G. Age:  31w 3d         91  %    CI:         77.8   %    70 - 86                                                          FL/HC:      19.8   %    19.6 - 20.8  HC:      281.3  mm     G. Age:  30w 6d         58  %    HC/AC:      1.16        0.99 - 1.21  AC:      243.2  mm     G. Age:  28w 4d         20  %    FL/BPD:     71.2   %    71 - 87  FL:       55.8  mm     G. Age:  29w 3d         33  %    FL/AC:      22.9   %    20 - 24  Est. FW:    1362  gm           3 lb     31  % ---------------------------------------------------------------------- OB History  Gravidity:    2         Term:   1  Living:       1 ---------------------------------------------------------------------- Gestational Age  LMP:           29w 3d        Date:  11/11/18                 EDD:   08/18/19  U/S Today:     30w 1d                                        EDD:   08/13/19  Best:          29w 3d     Det. By:  LMP  (11/11/18)          EDD:   08/18/19 ---------------------------------------------------------------------- Anatomy  Cranium:               Appears normal         Aortic Arch:            Previously seen  Cavum:                 Appears normal         Ductal Arch:            Previously seen  Ventricles:            Appears  normal         Diaphragm:              Previously seen  Choroid Plexus:  Appears normal         Stomach:                Appears normal, left                                                                        sided  Cerebellum:            Previously seen        Abdomen:                Previously seen  Posterior Fossa:       Previously seen        Abdominal Wall:         Previously seen  Nuchal Fold:           Previously seen        Cord Vessels:           Previously seen  Face:                  Profile previously     Kidneys:                Appear normal                         seen  Lips:                  Previously seen        Bladder:                Appears normal  Heart:                 Previously seen        Spine:                  Previously seen  RVOT:                  Previously seen        Upper Extremities:      Previously seen  LVOT:                  Previously seen        Lower Extremities:      Previously seen  Other:  Fetus appears to be a female. ---------------------------------------------------------------------- Impression  Normal interval growth.  History of gastric bypass ---------------------------------------------------------------------- Recommendations  Repeat growth in 4-6 weeks. ----------------------------------------------------------------------               Sander Nephew, MD Electronically Signed Final Report   06/06/2019 01:56 pm ----------------------------------------------------------------------   Assessment and Plan:  Pregnancy: G2P1001 at [redacted]w[redacted]d 1. Supervision of other normal pregnancy, antepartum - growth [place] scheduled for hx of gastric bypass  2. Anemia in pregnancy  Preterm labor symptoms and general obstetric precautions including but not limited to vaginal bleeding, contractions, leaking of fluid and fetal movement were reviewed in detail with the patient. I discussed the assessment and treatment plan with the patient. The patient was provided an  opportunity to ask questions and all were answered. The patient agreed with the plan and demonstrated an understanding of the instructions. The patient was advised to  call back or seek an in-person office evaluation/go to MAU at [facility] for any urgent or concerning symptoms. Please refer to After Visit Summary for other counseling recommendations.   I provided 7 minutes of face-to-face time during this encounter.  Return in about 4 weeks (around 07/27/2019).- in person  Future Appointments  Date Time Provider [place]  07/06/2019  8:00 AM WH-MFC [place] 3 WH-MFCUS MFC-US  07/31/2019  8:15 AM Lajean Manes, CNM [facility]    Julianne Handler, [place] for [facility], [place]

## 2019-07-06 ENCOUNTER — Ambulatory Visit (HOSPITAL_COMMUNITY)
Admission: RE | Admit: 2019-07-06 | Discharge: 2019-07-06 | Disposition: A | Discharge status: Home / Self Care | Payer: BC Managed Care – PPO | Source: Ambulatory Visit | Attending: Obstetrics and Gynecology | Admitting: Obstetrics and Gynecology

## 2019-07-06 ENCOUNTER — Other Ambulatory Visit: Payer: Self-pay

## 2019-07-06 DIAGNOSIS — O99843 Bariatric surgery status complicating pregnancy, third trimester: Secondary | ICD-10-CM

## 2019-07-06 DIAGNOSIS — Z3A33 33 weeks gestation of pregnancy: Secondary | ICD-10-CM | POA: Diagnosis not present

## 2019-07-06 DIAGNOSIS — Z3689 Encounter for other specified antenatal screening: Secondary | ICD-10-CM | POA: Insufficient documentation

## 2019-07-06 DIAGNOSIS — O2693 Pregnancy related conditions, unspecified, third trimester: Secondary | ICD-10-CM | POA: Diagnosis not present

## 2019-07-06 DIAGNOSIS — Z362 Encounter for other antenatal screening follow-up: Secondary | ICD-10-CM

## 2019-07-31 ENCOUNTER — Other Ambulatory Visit: Payer: Self-pay

## 2019-07-31 ENCOUNTER — Ambulatory Visit (INDEPENDENT_AMBULATORY_CARE_PROVIDER_SITE_OTHER): Payer: BC Managed Care – PPO | Admitting: Certified Nurse Midwife

## 2019-07-31 ENCOUNTER — Encounter: Payer: Self-pay | Admitting: Certified Nurse Midwife

## 2019-07-31 VITALS — BP 106/69 | HR 72 | Wt 223.0 lb

## 2019-07-31 DIAGNOSIS — Z3A36 36 weeks gestation of pregnancy: Secondary | ICD-10-CM

## 2019-07-31 DIAGNOSIS — O09293 Supervision of pregnancy with other poor reproductive or obstetric history, third trimester: Secondary | ICD-10-CM

## 2019-07-31 DIAGNOSIS — O09299 Supervision of pregnancy with other poor reproductive or obstetric history, unspecified trimester: Secondary | ICD-10-CM

## 2019-07-31 DIAGNOSIS — O99019 Anemia complicating pregnancy, unspecified trimester: Secondary | ICD-10-CM

## 2019-07-31 DIAGNOSIS — Z113 Encounter for screening for infections with a predominantly sexual mode of transmission: Secondary | ICD-10-CM

## 2019-07-31 DIAGNOSIS — Z3403 Encounter for supervision of normal first pregnancy, third trimester: Secondary | ICD-10-CM

## 2019-07-31 DIAGNOSIS — O99013 Anemia complicating pregnancy, third trimester: Secondary | ICD-10-CM

## 2019-07-31 LAB — OB RESULTS CONSOLE GBS: GBS: NEGATIVE

## 2019-07-31 NOTE — Patient Instructions (Signed)
Reasons to go to MAU:  1.  Contractions are  5 minutes apart or less, each last 1 minute, these have been going on for 1-2 hours, and you cannot walk or talk during them 2.  You have a large gush of fluid, or a trickle of fluid that will not stop and you have to wear a pad 3.  You have bleeding that is bright red, heavier than spotting--like menstrual bleeding (spotting can be normal in early labor or after a check of your cervix) 4.  You do not feel the baby moving like he/she normally does  

## 2019-07-31 NOTE — Progress Notes (Signed)
   PRENATAL VISIT NOTE  Subjective:  Kaylee [facility] is a 32 y.o. G2P1001 at [redacted]w[redacted]d being seen today for ongoing prenatal care.  She is currently monitored for the following issues for this low-risk pregnancy and has Family history of cancer; Family history of breast cancer gene mutation in first degree relative; Overweight (BMI 25.0-29.9); Polyarthralgia; Depression/fibromyalgia/chronic fatigue syndrome; Shellfish allergy; History of multiple allergies; History of food anaphylaxis; Ear itching; Chronic fatigue syndrome with fibromyalgia; Rheumatoid factor positive; Supervision of normal pregnancy; History of postpartum hemorrhage, currently pregnant; and Anemia during pregnancy on their problem list.  Patient reports no complaints.  Contractions: irregular braxton hicks. Vag. Bleeding: None.  Movement: Present. Denies leaking of fluid. Pt travels for work and has concerns about how far she should be traveling this late in her pregnancy.   The following portions of the patient's history were reviewed and updated as appropriate: allergies, current medications, past family history, past medical history, past social history, past surgical history and problem list.   Objective:   Vitals:   07/31/19 0813  BP: 106/69  Pulse: 72  Weight: 101.2 kg    Fetal Status: Fetal Heart Rate (bpm): 134 Fundal Height: 37 cm Movement: Present  Presentation: Vertex  General:  Alert, oriented and cooperative. Patient is in no acute distress.  Skin: Skin is warm and dry. No rash noted.   Cardiovascular: Normal heart rate noted  Respiratory: Normal respiratory effort, no problems with respiration noted  Abdomen: Soft, gravid, appropriate for gestational age.  Pain/Pressure: Present     Pelvic: Cervical exam performed Dilation: Closed Effacement (%): Thick Station: -3  Extremities: Normal range of motion.  Edema: Trace  Mental Status: Normal mood and affect. Normal behavior. Normal judgment and thought content.    Assessment and Plan:  Pregnancy: G2P1001 at [redacted]w[redacted]d 1. Encounter for supervision of normal first pregnancy in third trimester -Routine OB visit -GBS and GC/CT collected -Requests cervical exam. Cervix closed.  -Pt instructed that she should try to stay locally in case labor begins. Work note given.  -Anticipatory guidance for upcoming appointments and labor given  - Culture, beta strep (group b only) - Cervicovaginal ancillary only( [place])  2. History of postpartum hemorrhage, currently pregnant   3. Anemia during pregnancy   Term labor symptoms and general obstetric precautions including but not limited to vaginal bleeding, contractions, leaking of fluid and fetal movement were reviewed in detail with the patient. Please refer to After Visit Summary for other counseling recommendations.   Return in about 1 week (around 08/07/2019) for ROB-mychart.  No future appointments.  Maryagnes Amos, SNM

## 2019-08-02 LAB — CERVICOVAGINAL ANCILLARY ONLY
Chlamydia: NEGATIVE
Neisseria Gonorrhea: NEGATIVE

## 2019-08-03 LAB — CULTURE, BETA STREP (GROUP B ONLY)
MICRO NUMBER:: 809962
SPECIMEN QUALITY:: ADEQUATE

## 2019-08-07 ENCOUNTER — Telehealth (INDEPENDENT_AMBULATORY_CARE_PROVIDER_SITE_OTHER): Payer: BC Managed Care – PPO | Admitting: Certified Nurse Midwife

## 2019-08-07 ENCOUNTER — Other Ambulatory Visit: Payer: Self-pay

## 2019-08-07 ENCOUNTER — Encounter: Payer: Self-pay | Admitting: Certified Nurse Midwife

## 2019-08-07 DIAGNOSIS — O99013 Anemia complicating pregnancy, third trimester: Secondary | ICD-10-CM

## 2019-08-07 DIAGNOSIS — Z3A37 37 weeks gestation of pregnancy: Secondary | ICD-10-CM

## 2019-08-07 DIAGNOSIS — Z3480 Encounter for supervision of other normal pregnancy, unspecified trimester: Secondary | ICD-10-CM

## 2019-08-07 DIAGNOSIS — O99019 Anemia complicating pregnancy, unspecified trimester: Secondary | ICD-10-CM

## 2019-08-07 NOTE — Progress Notes (Signed)
   [place] VIRTUAL VIDEO VISIT ENCOUNTER NOTE  Provider location: [facility] at [place]   I connected with Kaylee [facility] on 08/07/19 at  9:12 AM EDT by MyChart Video Encounter at home and verified that I am speaking with the correct person using two identifiers.   I discussed the limitations, risks, security and privacy concerns of performing an evaluation and management service virtually and the availability of in person appointments. I also discussed with the patient that there may be a patient responsible charge related to this service. The patient expressed understanding and agreed to proceed. Subjective:  Kaylee [facility] is a 33 y.o. G2P1001 at [redacted]w[redacted]d being seen today for ongoing prenatal care.  She is currently monitored for the following issues for this low-risk pregnancy and has Family history of cancer; Family history of breast cancer gene mutation in first degree relative; Overweight (BMI 25.0-29.9); Polyarthralgia; Depression/fibromyalgia/chronic fatigue syndrome; Shellfish allergy; History of multiple allergies; History of food anaphylaxis; Ear itching; Chronic fatigue syndrome with fibromyalgia; Rheumatoid factor positive; Supervision of normal pregnancy; History of postpartum hemorrhage, currently pregnant; and Anemia during pregnancy on their problem list.  Patient reports occasional contractions.  Contractions: Irritability. Vag. Bleeding: None.  Movement: Present. Denies any leaking of fluid.   The following portions of the patient's history were reviewed and updated as appropriate: allergies, current medications, past family history, past medical history, past social history, past surgical history and problem list.   Objective:   Vitals:   08/07/19 0854  BP: 110/70  Pulse: 74  Weight: 223 lb (101.2 kg)    Fetal Status:     Movement: Present     General:  Alert, oriented and cooperative. Patient is in no acute distress.   Respiratory: Normal respiratory effort, no problems with respiration noted  Mental Status: Normal mood and affect. Normal behavior. Normal judgment and thought content.  Rest of physical exam deferred due to type of encounter  Imaging: No results found.  Assessment and Plan:  Pregnancy: G2P1001 at [redacted]w[redacted]d 1. Anemia during pregnancy - continue iron supplementation   2. Supervision of other normal pregnancy, antepartum - Routine prenatal care - Anticipatory guidance on upcoming appointments - Educated and discussed use of EPO, RRT, IC, dates and walking to induce contractions and cervical ripening at home  - mychart message sent to patient including details on EPO and RRT  - Discussed membrane sweep at 39 weeks, patient request membrane sweep had this done with previous pregnancy   Term labor symptoms and general obstetric precautions including but not limited to vaginal bleeding, contractions, leaking of fluid and fetal movement were reviewed in detail with the patient. I discussed the assessment and treatment plan with the patient. The patient was provided an opportunity to ask questions and all were answered. The patient agreed with the plan and demonstrated an understanding of the instructions. The patient was advised to call back or seek an in-person office evaluation/go to [facility]U at [facility] for any urgent or concerning symptoms. Please refer to After Visit Summary for other counseling recommendations.   I provided 9 minutes of face-to-face time during this encounter.  Return in about 8 days (around 08/15/2019) for ROB/membrane sweep.  Future Appointments  Date Time Provider [place]  08/16/2019  3:15 PM Emily Filbert, MD [place], [place] for [facility], [place]

## 2019-08-07 NOTE — Patient Instructions (Signed)
  Cervical Ripening: May try one or both  Red Raspberry Leaf capsules:  two 300mg  or 400mg  tablets with each meal, 2-3 times a day  Potential Side Effects Of Raspberry Leaf:  Most women do not experience any side effects from drinking raspberry leaf tea. However, nausea and loose stools are possible   Evening Primrose Oil capsules: may take 1 to 3 capsules daily. May also prick one to release the oil and insert it into your vagina at night.  Some of the potential side effects:  Upset stomach  Loose stools or diarrhea  Headaches  Nausea:

## 2019-08-10 ENCOUNTER — Encounter: Payer: Self-pay | Admitting: *Deleted

## 2019-08-10 NOTE — Progress Notes (Signed)
FMLA and STD forms filled out and scanned into charge

## 2019-08-14 ENCOUNTER — Encounter: Payer: BC Managed Care – PPO | Admitting: Advanced Practice Midwife

## 2019-08-16 ENCOUNTER — Other Ambulatory Visit: Payer: Self-pay

## 2019-08-16 ENCOUNTER — Ambulatory Visit (INDEPENDENT_AMBULATORY_CARE_PROVIDER_SITE_OTHER): Payer: BC Managed Care – PPO | Admitting: Obstetrics & Gynecology

## 2019-08-16 VITALS — BP 127/70 | HR 88 | Wt 223.0 lb

## 2019-08-16 DIAGNOSIS — O99019 Anemia complicating pregnancy, unspecified trimester: Secondary | ICD-10-CM

## 2019-08-16 DIAGNOSIS — Z23 Encounter for immunization: Secondary | ICD-10-CM | POA: Diagnosis not present

## 2019-08-16 DIAGNOSIS — O99213 Obesity complicating pregnancy, third trimester: Secondary | ICD-10-CM

## 2019-08-16 DIAGNOSIS — O99013 Anemia complicating pregnancy, third trimester: Secondary | ICD-10-CM

## 2019-08-16 DIAGNOSIS — O99210 Obesity complicating pregnancy, unspecified trimester: Secondary | ICD-10-CM

## 2019-08-16 DIAGNOSIS — Z3A39 39 weeks gestation of pregnancy: Secondary | ICD-10-CM

## 2019-08-16 DIAGNOSIS — Z3480 Encounter for supervision of other normal pregnancy, unspecified trimester: Secondary | ICD-10-CM

## 2019-08-16 NOTE — Progress Notes (Signed)
Membrane sweeping

## 2019-08-16 NOTE — Progress Notes (Signed)
   PRENATAL VISIT NOTE  Subjective:  Kaylee [facility] is a 33 y.o. G2P1001 at [redacted]w[redacted]d being seen today for ongoing prenatal care.  She is currently monitored for the following issues for this low-risk pregnancy and has Family history of cancer; Family history of breast cancer gene mutation in first degree relative; Overweight (BMI 25.0-29.9); Polyarthralgia; Depression/fibromyalgia/chronic fatigue syndrome; Shellfish allergy; History of multiple allergies; History of food anaphylaxis; Ear itching; Chronic fatigue syndrome with fibromyalgia; Rheumatoid factor positive; Supervision of normal pregnancy; History of postpartum hemorrhage, currently pregnant; and Anemia during pregnancy on their problem list.  Patient reports no complaints.   .  .   . Denies leaking of fluid.   The following portions of the patient's history were reviewed and updated as appropriate: allergies, current medications, past family history, past medical history, past social history, past surgical history and problem list.   Objective:  There were no vitals filed for this visit.  Fetal Status:           General:  Alert, oriented and cooperative. Patient is in no acute distress.  Skin: Skin is warm and dry. No rash noted.   Cardiovascular: Normal heart rate noted  Respiratory: Normal respiratory effort, no problems with respiration noted  Abdomen: Soft, gravid, appropriate for gestational age.        Pelvic: Cervical exam performed        Extremities: Normal range of motion.     Mental Status: Normal mood and affect. Normal behavior. Normal judgment and thought content.   Assessment and Plan:  Pregnancy: G2P1001 at [redacted]w[redacted]d 1. Anemia during pregnancy - flu vaccine todya  2. Supervision of other normal pregnancy, antepartum - I told her to go to [facility] as soon as she has any consistency to her contractions or if they feel strong at all.  3. Obesity in pregnancy   Term labor symptoms and general obstetric precautions  including but not limited to vaginal bleeding, contractions, leaking of fluid and fetal movement were reviewed in detail with the patient. Please refer to After Visit Summary for other counseling recommendations.   No follow-ups on file.  No future appointments.  Emily Filbert, MD

## 2019-08-17 ENCOUNTER — Inpatient Hospital Stay (HOSPITAL_COMMUNITY)
Admission: AD | Admit: 2019-08-17 | Discharge: 2019-08-18 | DRG: 807 | Disposition: A | Discharge status: Home / Self Care | Payer: BC Managed Care – PPO | Attending: Obstetrics and Gynecology | Admitting: Obstetrics and Gynecology

## 2019-08-17 ENCOUNTER — Encounter (HOSPITAL_COMMUNITY): Payer: Self-pay | Admitting: *Deleted

## 2019-08-17 DIAGNOSIS — O9902 Anemia complicating childbirth: Secondary | ICD-10-CM | POA: Diagnosis present

## 2019-08-17 DIAGNOSIS — O99019 Anemia complicating pregnancy, unspecified trimester: Secondary | ICD-10-CM

## 2019-08-17 DIAGNOSIS — O99013 Anemia complicating pregnancy, third trimester: Secondary | ICD-10-CM

## 2019-08-17 DIAGNOSIS — O9989 Other specified diseases and conditions complicating pregnancy, childbirth and the puerperium: Secondary | ICD-10-CM | POA: Diagnosis present

## 2019-08-17 DIAGNOSIS — D649 Anemia, unspecified: Secondary | ICD-10-CM | POA: Diagnosis present

## 2019-08-17 DIAGNOSIS — Z3A39 39 weeks gestation of pregnancy: Secondary | ICD-10-CM

## 2019-08-17 DIAGNOSIS — Z20828 Contact with and (suspected) exposure to other viral communicable diseases: Secondary | ICD-10-CM | POA: Diagnosis present

## 2019-08-17 DIAGNOSIS — M797 Fibromyalgia: Secondary | ICD-10-CM | POA: Diagnosis present

## 2019-08-17 DIAGNOSIS — O09299 Supervision of pregnancy with other poor reproductive or obstetric history, unspecified trimester: Secondary | ICD-10-CM

## 2019-08-17 DIAGNOSIS — R5382 Chronic fatigue, unspecified: Secondary | ICD-10-CM | POA: Diagnosis present

## 2019-08-17 DIAGNOSIS — O26893 Other specified pregnancy related conditions, third trimester: Secondary | ICD-10-CM | POA: Diagnosis present

## 2019-08-17 LAB — CBC
HCT: 28.6 % — ABNORMAL LOW (ref 36.0–46.0)
Hemoglobin: 9.1 g/dL — ABNORMAL LOW (ref 12.0–15.0)
MCH: 28.8 pg (ref 26.0–34.0)
MCHC: 31.8 g/dL (ref 30.0–36.0)
MCV: 90.5 fL (ref 80.0–100.0)
Platelets: 439 10*3/uL — ABNORMAL HIGH (ref 150–400)
RBC: 3.16 MIL/uL — ABNORMAL LOW (ref 3.87–5.11)
RDW: 14.2 % (ref 11.5–15.5)
WBC: 9.3 10*3/uL (ref 4.0–10.5)
nRBC: 0 % (ref 0.0–0.2)

## 2019-08-17 LAB — TYPE AND SCREEN
ABO/RH(D): A POS
Antibody Screen: NEGATIVE

## 2019-08-17 LAB — RPR: RPR Ser Ql: NONREACTIVE

## 2019-08-17 LAB — POCT FERN TEST: POCT Fern Test: NEGATIVE — AB

## 2019-08-17 LAB — SARS CORONAVIRUS 2 BY RT PCR (HOSPITAL ORDER, PERFORMED IN ~~LOC~~ HOSPITAL LAB): SARS Coronavirus 2: NEGATIVE

## 2019-08-17 MED ORDER — IBUPROFEN 600 MG PO TABS
600.0000 mg | ORAL_TABLET | Freq: Four times a day (QID) | ORAL | Status: DC
  Administered 2019-08-17 – 2019-08-18 (×5): 600 mg via ORAL
  Filled 2019-08-17 (×7): qty 1

## 2019-08-17 MED ORDER — SOD CITRATE-CITRIC ACID 500-334 MG/5ML PO SOLN: 30.0000 mL | ORAL | Status: DC | PRN

## 2019-08-17 MED ORDER — LACTATED RINGERS IV SOLN
INTRAVENOUS | Status: DC
  Administered 2019-08-17 (×2): via INTRAVENOUS

## 2019-08-17 MED ORDER — WITCH HAZEL-GLYCERIN EX PADS: 1.0000 "application " | MEDICATED_PAD | CUTANEOUS | Status: DC | PRN

## 2019-08-17 MED ORDER — SIMETHICONE 80 MG PO CHEW: 80.0000 mg | CHEWABLE_TABLET | ORAL | Status: DC | PRN

## 2019-08-17 MED ORDER — OXYTOCIN BOLUS FROM INFUSION
500.0000 mL | Freq: Once | INTRAVENOUS | Status: AC
  Administered 2019-08-17: 500 mL via INTRAVENOUS

## 2019-08-17 MED ORDER — BENZOCAINE-MENTHOL 20-0.5 % EX AERO
1.0000 "application " | INHALATION_SPRAY | CUTANEOUS | Status: DC | PRN
  Administered 2019-08-17 – 2019-08-18 (×2): 1 via TOPICAL
  Filled 2019-08-17 (×2): qty 56

## 2019-08-17 MED ORDER — DIPHENHYDRAMINE HCL 25 MG PO CAPS: 25.0000 mg | ORAL_CAPSULE | Freq: Four times a day (QID) | ORAL | Status: DC | PRN

## 2019-08-17 MED ORDER — ONDANSETRON HCL 4 MG/2ML IJ SOLN: 4.0000 mg | INTRAMUSCULAR | Status: DC | PRN

## 2019-08-17 MED ORDER — TETANUS-DIPHTH-ACELL PERTUSSIS 5-2.5-18.5 LF-MCG/0.5 IM SUSP: 0.5000 mL | Freq: Once | INTRAMUSCULAR | Status: DC

## 2019-08-17 MED ORDER — OXYCODONE-ACETAMINOPHEN 5-325 MG PO TABS: 1.0000 | ORAL_TABLET | ORAL | Status: DC | PRN

## 2019-08-17 MED ORDER — LIDOCAINE HCL (PF) 1 % IJ SOLN
30.0000 mL | INTRAMUSCULAR | Status: AC | PRN
  Administered 2019-08-17: 30 mL via SUBCUTANEOUS
  Filled 2019-08-17: qty 30

## 2019-08-17 MED ORDER — FENTANYL CITRATE (PF) 100 MCG/2ML IJ SOLN
100.0000 ug | INTRAMUSCULAR | Status: DC | PRN
  Administered 2019-08-17 (×2): 100 ug via INTRAVENOUS
  Filled 2019-08-17: qty 2

## 2019-08-17 MED ORDER — OXYCODONE-ACETAMINOPHEN 5-325 MG PO TABS: 2.0000 | ORAL_TABLET | ORAL | Status: DC | PRN

## 2019-08-17 MED ORDER — DIBUCAINE (PERIANAL) 1 % EX OINT: 1.0000 "application " | TOPICAL_OINTMENT | CUTANEOUS | Status: DC | PRN

## 2019-08-17 MED ORDER — OXYTOCIN 40 UNITS IN NORMAL SALINE INFUSION - SIMPLE MED
2.5000 [IU]/h | INTRAVENOUS | Status: DC
  Filled 2019-08-17: qty 1000

## 2019-08-17 MED ORDER — LACTATED RINGERS IV SOLN
500.0000 mL | INTRAVENOUS | Status: DC | PRN
  Administered 2019-08-17: 1000 mL via INTRAVENOUS

## 2019-08-17 MED ORDER — ONDANSETRON HCL 4 MG/2ML IJ SOLN: 4.0000 mg | Freq: Four times a day (QID) | INTRAMUSCULAR | Status: DC | PRN

## 2019-08-17 MED ORDER — SENNOSIDES-DOCUSATE SODIUM 8.6-50 MG PO TABS
2.0000 | ORAL_TABLET | ORAL | Status: DC
  Administered 2019-08-17: 2 via ORAL
  Filled 2019-08-17: qty 2

## 2019-08-17 MED ORDER — FENTANYL CITRATE (PF) 100 MCG/2ML IJ SOLN
INTRAMUSCULAR | Status: AC
  Administered 2019-08-17: 100 ug via INTRAVENOUS
  Filled 2019-08-17: qty 2

## 2019-08-17 MED ORDER — ACETAMINOPHEN 325 MG PO TABS
650.0000 mg | ORAL_TABLET | ORAL | Status: DC | PRN
  Administered 2019-08-17 (×3): 650 mg via ORAL
  Filled 2019-08-17 (×4): qty 2

## 2019-08-17 MED ORDER — ACETAMINOPHEN 325 MG PO TABS: 650.0000 mg | ORAL_TABLET | ORAL | Status: DC | PRN

## 2019-08-17 MED ORDER — ZOLPIDEM TARTRATE 5 MG PO TABS: 5.0000 mg | ORAL_TABLET | Freq: Every evening | ORAL | Status: DC | PRN

## 2019-08-17 MED ORDER — ONDANSETRON HCL 4 MG PO TABS: 4.0000 mg | ORAL_TABLET | ORAL | Status: DC | PRN

## 2019-08-17 MED ORDER — COCONUT OIL OIL: 1.0000 "application " | TOPICAL_OIL | Status: DC | PRN

## 2019-08-17 MED ORDER — PRENATAL MULTIVITAMIN CH
1.0000 | ORAL_TABLET | Freq: Every day | ORAL | Status: DC
  Administered 2019-08-17 – 2019-08-18 (×2): 1 via ORAL
  Filled 2019-08-17 (×2): qty 1

## 2019-08-17 NOTE — Discharge Summary (Addendum)
OB Discharge Summary     Patient Name: Kaylee [place] DOB: [birth year] MRN: [id number]  Date of admission: 08/17/2019 Delivering MD: Merilyn Baba   Date of discharge: 08/18/2019  Admitting diagnosis: ctx water broke Intrauterine pregnancy: [redacted]w[redacted]d     Secondary diagnosis:  Active Problems:   History of postpartum hemorrhage, currently pregnant   Anemia during pregnancy   Normal labor  Additional problems: None     Discharge diagnosis: Term Pregnancy Delivered                                                                                                Post partum procedures:None  Augmentation: AROM  Complications: None  Hospital course:  Onset of Labor With Vaginal Delivery     33 y.o. yo G2P1001 at [redacted]w[redacted]d was admitted in Active Labor on 08/17/2019. Patient had an uncomplicated labor course as follows:  Membrane Rupture Time/Date: 3:07 AM ,08/17/2019   Intrapartum Procedures: Episiotomy: None [1]                                         Lacerations:  1st degree [2];Perineal [11];Vaginal [6]  Patient had a delivery of a Viable infant. 08/17/2019  Information for the patient's newborn:  Aleane, Perini [id number]  Delivery Method: [place] had an uncomplicated postpartum course.  She is ambulating, tolerating a regular diet, passing flatus, and urinating well. Patient is discharged home in stable condition on 08/18/19.   Physical exam  Vitals:   08/17/19 1330 08/17/19 1705 08/17/19 2100 08/18/19 0528  BP: 127/71 124/72 115/76 115/75  Pulse: 82 71 81 73  Resp: 17 18 18 18   Temp: 98.2 F (36.8 C) 98.1 F (36.7 C) 98.2 F (36.8 C) 97.7 F (36.5 C)  TempSrc: Oral Oral Oral Oral  Weight:      Height:       General: alert, cooperative and no distress Lochia: appropriate Uterine Fundus: firm @ U-1 Incision: N/A DVT Evaluation: No evidence of DVT seen on physical exam. Negative Homan's sign. No cords or calf tenderness. No significant calf/ankle  edema. Labs: Lab Results  Component Value Date   WBC 8.4 08/18/2019   HGB 7.8 (L) 08/18/2019   HCT 25.3 (L) 08/18/2019   MCV 92.0 08/18/2019   PLT 379 08/18/2019   CMP Latest Ref Rng & Units 01/15/2019  Glucose 65 - 99 mg/dL 87  BUN 7 - 25 mg/dL 8  Creatinine 0.50 - 1.10 mg/dL 0.72  Sodium 135 - 146 mmol/L 134(L)  Potassium 3.5 - 5.3 mmol/L 4.1  Chloride 98 - 110 mmol/L 101  CO2 20 - 32 mmol/L 23  Calcium 8.6 - 10.2 mg/dL 8.8  Total Protein 6.1 - 8.1 g/dL 6.8  Total Bilirubin 0.2 - 1.2 mg/dL 0.4  Alkaline Phos 33 - 115 U/L -  AST 10 - 30 U/L 11  ALT 6 - 29 U/L 8    Discharge instruction: per After Visit Summary and "Baby and Me Booklet".  After visit meds:  Allergies as of 08/18/2019      Reactions   Iodine Anaphylaxis   Levaquin [levofloxacin In D5w] Itching   Headaches, vomiting   Sulfa Antibiotics Itching   Migraine, vomiting   Shellfish Allergy Swelling   Apple Itching      Medication List    TAKE these medications   acetaminophen 500 MG tablet Commonly known as: TYLENOL Take 2 tablets (1,000 mg total) by mouth every 6 (six) hours as needed for moderate pain.   cyclobenzaprine 5 MG tablet Commonly known as: FLEXERIL Take 1-2 tablets (5-10 mg total) by mouth at bedtime. What changed:   when to take this  reasons to take this   EPINEPHrine 0.3 mg/0.3 mL Soaj injection Commonly known as: EpiPen 2-Pak Inject 0.3 mLs (0.3 mg total) into the muscle once as needed for up to 1 dose.   ferrous sulfate 325 (65 FE) MG tablet Take 1 tablet (325 mg total) by mouth daily with breakfast. What changed: Another medication with the same name was added. Make sure you understand how and when to take each.   ferrous sulfate 325 (65 FE) MG tablet Take 1 tablet (325 mg total) by mouth 2 (two) times daily with a meal. What changed: You were already taking a medication with the same name, and this prescription was added. Make sure you understand how and when to take each.    ondansetron 8 MG tablet Commonly known as: Zofran Take 1 tablet (8 mg total) by mouth every 8 (eight) hours as needed for nausea or vomiting.   PRENATAL VITAMIN PO Take 1 capsule by mouth daily.       Diet: routine diet  Activity: Advance as tolerated. Pelvic rest for 6 weeks.   Follow up Appt: Please schedule this patient for Postpartum visit in: 4 weeks with the following provider: Any provider For C/S patients schedule nurse incision check in weeks 2 weeks: no Low risk pregnancy complicated by: Hx fast labor Delivery mode:  SVD Anticipated Birth Control:  IUD PP Procedures needed: IUD placement  Schedule Integrated BH visit: no  No future appointments. Follow up Visit:No follow-ups on file.  Postpartum contraception: IUD at postpartum visit  Newborn Data: Live born female Birth Weight:   2971 g APGAR: 40 and 9  Newborn Delivery   Birth date/time: 08/17/2019 05:27:00 Delivery type: Vaginal, Spontaneous      Baby Feeding: Breast Disposition:home with mother   08/18/2019 Merilyn Baba, DO   Attestation of Attending Supervision of OB Fellow: Evaluation, management, and procedures were performed by the [facility] Fellow under my supervision and collaboration.  I have reviewed the OB Fellow's note and chart, and I agree with the management and plan.  Feliz Beam, M.D. Attending Center for [facility] (Faculty Practice)  08/21/2019 8:40 AM

## 2019-08-17 NOTE — Lactation Note (Signed)
This note was copied from a baby's chart. Lactation Consultation Note  Patient Name: Kaylee Anderson Today's Date: 08/17/2019 Reason for consult: Initial assessment;Term  15 hours old FT female who is being exclusively BF by his mother, she's a P2 and experienced BF. Mom was able to BF her first baby for over 12 months and didn't report any BF difficulties. She's already familiar with hand expression and able to get some colostrum when doing so.  Mom has also been using a DEBP that her RN set up, but she told LC that the suction is just way too strong and she preferred to use it as a hand pump instead; she's using # 27 flanges which seem appropriate at this point.. She showed LC the bullets with the drops of colostrum she's been getting, she's been feeding it to baby, about 1 ml left in the fridge for the next feeding. She has an Elvie DEBP at home.  Offered assistance with latch but mom politely declined stating that she didn't need any assistance at this point, baby still doing STS with mom after his bath. Asked mom to call for assistance when needed. Per mom feedings at the breast are comfortable, unlike her older son, this baby is a "much better" breast feeder, he'll open his mouth wide enough to get a deep latch with flanged lips, praised her for her efforts. Reviewed normal newborn behavior, cluster feeding and feeding cues.   Feeding plan:  1. Encouraged mom to keep feeding baby STS 8-12 times/24 hours or sooner if feeding cues are present 2. Hand expression and spoon feeding were also encouraged, mom will also continue using the DEBP kit given as ah and pump as her preference, she already tried to lowest setting for the DEBP and it's still too strong  BF brochure, BF resources and feeding diary were reviewed. Parents reported all questions and concerns were answered, they're both aware of LC OP services and will call PRN.    Maternal Data Formula Feeding for Exclusion: No Has patient  been taught Hand Expression?: Yes Does the patient have breastfeeding experience prior to this delivery?: Yes  Feeding Feeding Type: Breast Fed   Interventions Interventions: Breast feeding basics reviewed;Hand express;DEBP  Lactation Tools Discussed/Used Tools: Pump Breast pump type: Double-Electric Breast Pump WIC Program: No Pump Review: Setup, frequency, and cleaning Initiated by:: RN Date initiated:: 08/17/19   Consult Status Consult Status: PRN    Milagros S Peck 08/17/2019, 8:34 PM    

## 2019-08-17 NOTE — MAU Provider Note (Signed)
Chief Complaint:  Rupture of Membranes     HPI: Kaylee Anderson is a 33 y.o. G2P1001 at 40w2dwho presents to maternity admissions reporting possible water leaking with contractions.  Had membrane sweeping and felt wetness afterward with bloody show. She reports good fetal movement, denies vaginal itching/burning, urinary symptoms, h/a, dizziness, n/v, diarrhea, constipation or fever/chills.    I was asked to rule out ruptured membranes  Past Medical History: Past Medical History:  Diagnosis Date  . Abnormal Pap smear of cervix 2008   cryo  . Dysthymia 06/21/2017  . Fibromyalgia   . HGSIL on cytologic smear of cervix    s/p colposcopy, most recent pap normal  . LGSIL on Pap smear of cervix   . Multiple lipomas   . PCOS (polycystic ovarian syndrome)   . Rheumatoid arthritis ([place]) 06/21/2017  . Trichomonal cervicitis     Past obstetric history: OB History  Gravida Para Term Preterm AB Living  2 1 1     1   SAB TAB Ectopic Multiple Live Births               # Outcome Date GA Lbr Len/2nd Weight Sex Delivery Anes PTL Lv  2 Current           1 Term 10/20/12   2835 g  Vag-Spont None N     Past Surgical History: Past Surgical History:  Procedure Laterality Date  . CHOLECYSTECTOMY    . COLPOSCOPY    . KNEE ARTHROSCOPY Left 2002  . LIPOMA EXCISION    . PARTIAL GASTRECTOMY      Family History: Family History  Problem Relation Age of Onset  . Hypertension Mother   . Diabetes Mother   . Cancer Mother 69       breast  . Allergic rhinitis Mother   . Sarcoidosis Father   . Non-Hodgkin's lymphoma Father   . Hypertension Father   . Allergic rhinitis Father   . Food Allergy Father        shellfish allergy and iodine/contrast dye allergy  . Cancer Paternal Aunt 2       breast   . Allergic rhinitis Paternal Aunt   . Cancer Paternal Aunt 34       ovarian  . Cancer Maternal Aunt 48       breast  . Allergic rhinitis Maternal Aunt   . Cancer Maternal Aunt 50       breast and  ovarian  . Cancer Maternal Aunt 49       breast  . Cancer Maternal Aunt 48       breast  . Gout Brother   . Allergic rhinitis Brother   . Rheum arthritis Maternal Grandmother   . Allergic rhinitis Sister   . Angioedema Neg Hx   . Asthma Neg Hx   . Eczema Neg Hx   . Immunodeficiency Neg Hx   . Urticaria Neg Hx     Social History: Social History   Tobacco Use  . Smoking status: Never Smoker  . Smokeless tobacco: Never Used  Substance Use Topics  . Alcohol use: Not Currently    Alcohol/week: 0.0 standard drinks    Comment: occassional  . Drug use: No    Allergies:  Allergies  Allergen Reactions  . Iodine Anaphylaxis  . Levaquin [Levofloxacin In D5w] Itching    Headaches, vomiting  . Sulfa Antibiotics Itching    Migraine, vomiting  . Shellfish Allergy Swelling  . Apple Itching    Meds:  Facility-Administered Medications Prior to Admission  Medication Dose Route Frequency Provider Last Rate Last Dose  . triamcinolone 0.1 % cream : eucerin cream, 1:1   Topical BID Guss Bunde, MD       Medications Prior to Admission  Medication Sig Dispense Refill Last Dose  . acetaminophen (TYLENOL) 500 MG tablet Take 2 tablets (1,000 mg total) by mouth every 6 (six) hours as needed for moderate pain. 30 tablet 0 Past Week at Unknown time  . cyclobenzaprine (FLEXERIL) 5 MG tablet Take 1-2 tablets (5-10 mg total) by mouth at bedtime. 60 tablet 0 Past Month at Unknown time  . ferrous sulfate 325 (65 FE) MG tablet Take 1 tablet (325 mg total) by mouth daily with breakfast. 30 tablet 1 Past Month at Unknown time  . Prenatal Vit-Fe Fumarate-FA (PRENATAL VITAMIN PO) Take by mouth.   08/16/2019 at Unknown time  . EPINEPHrine (EPIPEN 2-PAK) 0.3 mg/0.3 mL IJ SOAJ injection Inject 0.3 mLs (0.3 mg total) into the muscle once as needed for up to 1 dose. 2 Device 1 Unknown at Unknown time  . FLUoxetine (PROZAC) 10 MG capsule Take 1 capsule (10 mg total) by mouth at bedtime for 3 days, THEN 2  capsules (20 mg total) at bedtime for 27 days. 60 capsule 0   . ondansetron (ZOFRAN) 8 MG tablet Take 1 tablet (8 mg total) by mouth every 8 (eight) hours as needed for nausea or vomiting. 30 tablet 0 More than a month at Unknown time    I have reviewed patient's Past Medical Hx, Surgical Hx, Family Hx, Social Hx, medications and allergies.   ROS:  Review of Systems  Constitutional: Negative for chills and fever.  Respiratory: Negative for shortness of breath.   Gastrointestinal: Positive for abdominal pain. Negative for constipation, diarrhea and nausea.  Genitourinary: Positive for vaginal bleeding and vaginal discharge. Negative for dysuria.   Other systems negative  Physical Exam   Patient Vitals for the past 24 hrs:  BP Temp Pulse Resp Height Weight  08/17/19 0023 113/70 - 91 - - -  08/17/19 0021 - 98.5 F (36.9 C) - 18 5\' 8"  (1.727 m) 101.6 kg   Constitutional: Well-developed, well-nourished female in no acute distress.  Cardiovascular: normal rate and rhythm Respiratory: normal effort, clear to auscultation bilaterally GI: Abd soft, non-tender, gravid appropriate for gestational age.   No rebound or guarding. MS: Extremities nontender, no edema, normal ROM Neurologic: Alert and oriented x 4.  GU: Neg CVAT.  PELVIC EXAM: Cervix pink, visually closed, without lesion, small amount of pink tinged mucous, vaginal walls and external genitalia normal  Dilation: 6 Effacement (%): 80 Station: -2 Presentation: Vertex Exam by:: lauren cox rn   My exam: Dilation  5-6cm, posterior, soft Effacement 90 Station  -2-3 Vertex  FHT:  Baseline 140 , moderate variability, accelerations present, no decelerations Contractions: q 5 mins Irregular    Labs: Results for orders placed or performed during the hospital encounter of 08/17/19 (from the past 24 hour(s))  Fern Test     Status: Abnormal   Collection Time: 08/17/19  1:30 AM  Result Value Ref Range   POCT Fern Test Negative =  intact amniotic membranes (A)    A/RH(D) POSITIVE/-- (02/10 1345)  Imaging:  No results found.  MAU Course/MDM: NST reviewed and is reactive Category I  Treatments in MAU included EFM, sterile speculum exam.    Assessment: Single intrauterine pregnancy at [redacted]w[redacted]d Uterine contractions, rule out labor Vaginal discharge in pregnancy,  no evidence for ruptured membranes  Plan: Continue labor evaluation Labor team to follow  Hansel Feinstein CNM, MSN Certified Nurse-Midwife 08/17/2019 2:24 AM

## 2019-08-17 NOTE — MAU Note (Signed)
Pt was seen in office THurs and was 5cm. Having some ctxs but not regular. Started leaking fld about an hour ago. Fld is clear. Some bloody show from having membranes stripped on THurs

## 2019-08-17 NOTE — H&P (Signed)
OBSTETRIC ADMISSION HISTORY AND PHYSICAL  Kaylee Anderson is a 33 y.o. female G2P1001 with IUP at [redacted]w[redacted]d presenting for contractions. She thought that she had broken her water, but her fern test was negative. She has a history of fast labor, with subsequent precipitous delivery. She reports +FMs. No LOF, VB, blurry vision, headaches, peripheral edema, or RUQ pain. She plans on breastfeeding. She would like an IUD at her post-partum visit.  Dating: By [place] --->  Estimated Date of Delivery: 08/22/19  Sono:    @[redacted]w[redacted]d , CWD, normal anatomy, cephalic presentation, [id number], 46%ile, EFW 5#2   Prenatal History/Complications: Chronic fatigue syndrome with fibromyalgia/RF+ Hs PPH Anemia during pregnancy  Past Medical History: Past Medical History:  Diagnosis Date  . Abnormal Pap smear of cervix 2008   cryo  . Dysthymia 06/21/2017  . Fibromyalgia   . HGSIL on cytologic smear of cervix    s/p colposcopy, most recent pap normal  . LGSIL on Pap smear of cervix   . Multiple lipomas   . PCOS (polycystic ovarian syndrome)   . Rheumatoid arthritis ([place]) 06/21/2017  . Trichomonal cervicitis     Past Surgical History: Past Surgical History:  Procedure Laterality Date  . CHOLECYSTECTOMY    . COLPOSCOPY    . KNEE ARTHROSCOPY Left 2002  . LIPOMA EXCISION    . PARTIAL GASTRECTOMY      Obstetrical History: OB History    Gravida  2   Para  1   Term  1   Preterm      AB      Living  1     SAB      TAB      Ectopic      Multiple      Live Births              Social History: Social History   Socioeconomic History  . Marital status: Married    Spouse name: Not on file  . Number of children: Not on file  . Years of education: Not on file  . Highest education level: Not on file  Occupational History  . Occupation: Ship broker  Social Needs  . Financial resource strain: Not on file  . Food insecurity    Worry: Not on file    Inability: Not on file  . Transportation needs     Medical: Not on file    Non-medical: Not on file  Tobacco Use  . Smoking status: Never Smoker  . Smokeless tobacco: Never Used  Substance and Sexual Activity  . Alcohol use: Not Currently    Alcohol/week: 0.0 standard drinks    Comment: occassional  . Drug use: No  . Sexual activity: Yes    Partners: Male    Birth control/protection: None  Lifestyle  . Physical activity    Days per week: Not on file    Minutes per session: Not on file  . Stress: Not on file  Relationships  . Social Herbalist on phone: Not on file    Gets together: Not on file    Attends religious service: Not on file    Active member of club or organization: Not on file    Attends meetings of clubs or organizations: Not on file    Relationship status: Not on file  Other Topics Concern  . Not on file  Social History Narrative  . Not on file    Family History: Family History  Problem Relation Age of Onset  .  Hypertension Mother   . Diabetes Mother   . Cancer Mother 52       breast  . Allergic rhinitis Mother   . Sarcoidosis Father   . Non-Hodgkin's lymphoma Father   . Hypertension Father   . Allergic rhinitis Father   . Food Allergy Father        shellfish allergy and iodine/contrast dye allergy  . Cancer Paternal Aunt 7       breast   . Allergic rhinitis Paternal Aunt   . Cancer Paternal Aunt 57       ovarian  . Cancer Maternal Aunt 48       breast  . Allergic rhinitis Maternal Aunt   . Cancer Maternal Aunt 50       breast and ovarian  . Cancer Maternal Aunt 49       breast  . Cancer Maternal Aunt 48       breast  . Gout Brother   . Allergic rhinitis Brother   . Rheum arthritis Maternal Grandmother   . Allergic rhinitis Sister   . Angioedema Neg Hx   . Asthma Neg Hx   . Eczema Neg Hx   . Immunodeficiency Neg Hx   . Urticaria Neg Hx     Allergies: Allergies  Allergen Reactions  . Iodine Anaphylaxis  . Levaquin [Levofloxacin In D5w] Itching    Headaches, vomiting   . Sulfa Antibiotics Itching    Migraine, vomiting  . Shellfish Allergy Swelling  . Apple Itching    Facility-Administered Medications Prior to Admission  Medication Dose Route Frequency Provider Last Rate Last Dose  . triamcinolone 0.1 % cream : eucerin cream, 1:1   Topical BID Guss Bunde, MD       Medications Prior to Admission  Medication Sig Dispense Refill Last Dose  . acetaminophen (TYLENOL) 500 MG tablet Take 2 tablets (1,000 mg total) by mouth every 6 (six) hours as needed for moderate pain. 30 tablet 0 Past Week at Unknown time  . cyclobenzaprine (FLEXERIL) 5 MG tablet Take 1-2 tablets (5-10 mg total) by mouth at bedtime. 60 tablet 0 Past Month at Unknown time  . ferrous sulfate 325 (65 FE) MG tablet Take 1 tablet (325 mg total) by mouth daily with breakfast. 30 tablet 1 Past Month at Unknown time  . Prenatal Vit-Fe Fumarate-FA (PRENATAL VITAMIN PO) Take by mouth.   08/16/2019 at Unknown time  . EPINEPHrine (EPIPEN 2-PAK) 0.3 mg/0.3 mL IJ SOAJ injection Inject 0.3 mLs (0.3 mg total) into the muscle once as needed for up to 1 dose. 2 Device 1 Unknown at Unknown time  . FLUoxetine (PROZAC) 10 MG capsule Take 1 capsule (10 mg total) by mouth at bedtime for 3 days, THEN 2 capsules (20 mg total) at bedtime for 27 days. 60 capsule 0   . ondansetron (ZOFRAN) 8 MG tablet Take 1 tablet (8 mg total) by mouth every 8 (eight) hours as needed for nausea or vomiting. 30 tablet 0 More than a month at Unknown time     Review of Systems   All systems reviewed and negative except as stated in HPI  Blood pressure 113/70, pulse 91, temperature 98.5 F (36.9 C), resp. rate 18, height 5\' 8"  (1.727 m), weight 101.6 kg, last menstrual period 11/11/2018. General appearance: alert, cooperative, appears stated age and no distress Lungs: regular rate and effort Heart: regular rate  Abdomen: soft, non-tender Extremities: Homans sign is negative, no sign of DVT Presentation: cephalic Fetal  monitoringBaseline:  130 bpm, Variability: Good {> 6 bpm), Accelerations: Reactive and Decelerations: Absent Uterine activity contractions q 3-6 minutes Dilation: 6 Effacement (%): 80 Station: -2 Exam by:: lauren cox rn   Prenatal labs: ABO, Rh: A/RH(D) POSITIVE/-- (02/10 1345) Antibody: NO ANTIBODIES DETECTED (02/10 1345) Rubella: 1.86 (02/10 1345) RPR: NON-REACTIVE (06/26 0845)  HBsAg: NON-REACTIVE (02/10 1345)  HIV: NON-REACTIVE (06/26 0845)  GBS:   Negative 2 hr GTT - unknown   Prenatal Transfer Tool  Maternal Diabetes: No Genetic Screening: Normal Maternal Ultrasounds/Referrals: Normal Fetal Ultrasounds or other Referrals:  None Maternal Substance Abuse:  No Significant Maternal Medications:  None Significant Maternal Lab Results: Group B Strep negative  Results for orders placed or performed during the hospital encounter of 08/17/19 (from the past 24 hour(s))  Fern Test   Collection Time: 08/17/19  1:30 AM  Result Value Ref Range   POCT Fern Test Negative = intact amniotic membranes (A)     Patient Active Problem List   Diagnosis Date Noted  . Anemia during pregnancy 06/29/2019  . Supervision of normal pregnancy 01/15/2019  . History of postpartum hemorrhage, currently pregnant 01/15/2019  . Chronic fatigue syndrome with fibromyalgia 10/09/2018  . Rheumatoid factor positive 10/09/2018  . Shellfish allergy 07/03/2018  . History of multiple allergies 07/03/2018  . History of food anaphylaxis 07/03/2018  . Ear itching 07/03/2018  . Depression/fibromyalgia/chronic fatigue syndrome 12/16/2017  . Overweight (BMI 25.0-29.9) 11/24/2017  . Polyarthralgia 11/24/2017  . Family history of breast cancer gene mutation in first degree relative 06/21/2017  . Family history of cancer 12/08/2015    Assessment: Maricsa Spille is a 33 y.o. G2P1001 at [redacted]w[redacted]d here for active labor  1. Labor: AROM with clear fluid, pitocin augmentation as needed 2. FWB: Cat I tracing, continue to  monitor 3. Pain: Desires natural birth 4. GBS: negative   Plan: Admit to L&D with routine labs and orders AROM with clear fluid Continuous fetal monitoring Time appropriate cervical exams Anticipate NSVD, CS if appropriate  Karianna Gusman L Chidi Shirer, DO  08/17/2019, 1:57 AM

## 2019-08-18 LAB — CBC
HCT: 25.3 % — ABNORMAL LOW (ref 36.0–46.0)
Hemoglobin: 7.8 g/dL — ABNORMAL LOW (ref 12.0–15.0)
MCH: 28.4 pg (ref 26.0–34.0)
MCHC: 30.8 g/dL (ref 30.0–36.0)
MCV: 92 fL (ref 80.0–100.0)
Platelets: 379 10*3/uL (ref 150–400)
RBC: 2.75 MIL/uL — ABNORMAL LOW (ref 3.87–5.11)
RDW: 14.5 % (ref 11.5–15.5)
WBC: 8.4 10*3/uL (ref 4.0–10.5)
nRBC: 0 % (ref 0.0–0.2)

## 2019-08-18 MED ORDER — FERROUS SULFATE 325 (65 FE) MG PO TABS
325.0000 mg | ORAL_TABLET | Freq: Two times a day (BID) | ORAL | Status: DC
  Administered 2019-08-18: 325 mg via ORAL
  Filled 2019-08-18: qty 1

## 2019-08-18 MED ORDER — FERROUS SULFATE 325 (65 FE) MG PO TABS: 325.0000 mg | ORAL_TABLET | Freq: Two times a day (BID) | ORAL | 3 refills | Status: DC

## 2019-08-18 NOTE — Discharge Instructions (Signed)
Pregnancy and Anemia ° °Anemia is a condition in which the concentration of red blood cells, or hemoglobin, in the blood is below normal. Hemoglobin is a substance in red blood cells that carries oxygen to the tissues of the body. Anemia results when enough oxygen does not reach these tissues. °Anemia is common during pregnancy because the woman's body needs more blood volume and blood cells to provide nutrition to the fetus. The fetus needs iron and folic acid as it is developing. Your body may not produce enough red blood cells because of this. Also, during pregnancy, the liquid part of the blood (plasma) increases by about 30-50%, and the red blood cells increase by only 20%. This lowers the concentration of the red blood cells and creates a natural anemia-like situation. °What are the causes? °The most common cause of anemia during pregnancy is not having enough iron in the body to make red blood cells (iron deficiency anemia). Other causes may include: °· Folic acid deficiency. °· Vitamin B12 deficiency. °· Certain prescription or over-the-counter medicines. °· Certain medical conditions or infections that destroy red blood cells. °· A low platelet count and bleeding caused by antibodies that go through the placenta to the fetus from the mother’s blood. °What are the signs or symptoms? °Mild anemia may not be noticeable. If it becomes severe, symptoms may include: °· Feeling tired (fatigue). °· Shortness of breath, especially during activity. °· Weakness. °· Fainting. °· Pale looking skin. °· Headaches. °· A fast or irregular heartbeat (palpitations). °· Dizziness. °How is this diagnosed? °This condition may be diagnosed based on: °· Your medical history and a physical exam. °· Blood tests. °How is this treated? °Treatment for anemia during pregnancy depends on the cause of the anemia. Treatment can include: °· Dietary changes. °· Supplements of iron, vitamin B12, or folic acid. °· A blood transfusion. This may  be needed if anemia is severe. °· Hospitalization. This may be needed if there is a lot of blood loss or severe anemia. °Follow these instructions at home: °· Follow recommendations from your dietitian or health care provider about changing your diet. °· Increase your vitamin C intake. This will help the stomach absorb more iron. Some foods that are high in vitamin C include: °? Oranges. °? Peppers. °? Tomatoes. °? Mangoes. °· Eat a diet rich in iron. This would include foods such as: °? Liver. °? Beef. °? Eggs. °? Whole grains. °? Spinach. °? Dried fruit. °· Take iron and vitamins as told by your health care provider. °· Eat green leafy vegetables. These are a good source of folic acid. °· Keep all follow-up visits as told by your health care provider. This is important. °Contact a health care provider if: °· You have frequent or lasting headaches. °· You look pale. °· You bruise easily. °Get help right away if: °· You have extreme weakness, shortness of breath, or chest pain. °· You become dizzy or have trouble concentrating. °· You have heavy vaginal bleeding. °· You develop a rash. °· You have bloody or black, tarry stools. °· You faint. °· You vomit up blood. °· You vomit repeatedly. °· You have abdominal pain. °· You have a fever. °· You are dehydrated. °Summary °· Anemia is a condition in which the concentration of red blood cells or hemoglobin in the blood is below normal. °· Anemia is common during pregnancy because the woman's body needs more blood volume and blood cells to provide nutrition to the fetus. °· The most   common cause of anemia during pregnancy is not having enough iron in the body to make red blood cells (iron deficiency anemia).  Mild anemia may not be noticeable. If it becomes severe, symptoms may include feeling tired and weak. This information is not intended to replace advice given to you by your health care provider. Make sure you discuss any questions you have with your health care  provider. Document Released: 11/19/2000 Document Revised: 03/16/2019 Document Reviewed: 12/28/2016 Elsevier Patient Education  2020 [employer].  Postpartum Care After Vaginal Delivery This sheet gives you information about how to care for yourself from the time you deliver your baby to up to 6-12 weeks after delivery (postpartum period). Your health care provider may also give you more specific instructions. If you have problems or questions, contact your health care provider. Follow these instructions at home: Vaginal bleeding  It is normal to have vaginal bleeding (lochia) after delivery. Wear a sanitary pad for vaginal bleeding and discharge. ? During the first week after delivery, the amount and appearance of lochia is often similar to a menstrual period. ? Over the next few weeks, it will gradually decrease to a dry, yellow-brown discharge. ? For most women, lochia stops completely by 4-6 weeks after delivery. Vaginal bleeding can vary from woman to woman.  Change your sanitary pads frequently. Watch for any changes in your flow, such as: ? A sudden increase in volume. ? A change in color. ? Large blood clots.  If you pass a blood clot from your vagina, save it and call your health care provider to discuss. Do not flush blood clots down the toilet before talking with your health care provider.  Do not use tampons or douches until your health care provider says this is safe.  If you are not breastfeeding, your period should return 6-8 weeks after delivery. If you are feeding your child breast milk only (exclusive breastfeeding), your period may not return until you stop breastfeeding. Perineal care  Keep the area between the vagina and the anus (perineum) clean and dry as told by your health care provider. Use medicated pads and pain-relieving sprays and creams as directed.  If you had a cut in the perineum (episiotomy) or a tear in the vagina, check the area for signs of infection  until you are healed. Check for: ? More redness, swelling, or pain. ? Fluid or blood coming from the cut or tear. ? Warmth. ? Pus or a bad smell.  You may be given a squirt bottle to use instead of wiping to clean the perineum area after you go to the bathroom. As you start healing, you may use the squirt bottle before wiping yourself. Make sure to wipe gently.  To relieve pain caused by an episiotomy, a tear in the vagina, or swollen veins in the anus (hemorrhoids), try taking a warm sitz bath 2-3 times a day. A sitz bath is a warm water bath that is taken while you are sitting down. The water should only come up to your hips and should cover your buttocks. Breast care  Within the first few days after delivery, your breasts may feel heavy, full, and uncomfortable (breast engorgement). Milk may also leak from your breasts. Your health care provider can suggest ways to help relieve the discomfort. Breast engorgement should go away within a few days.  If you are breastfeeding: ? Wear a bra that supports your breasts and fits you well. ? Keep your nipples clean and dry. Apply  creams and ointments as told by your health care provider. ? You may need to use breast pads to absorb milk that leaks from your breasts. ? You may have uterine contractions every time you breastfeed for up to several weeks after delivery. Uterine contractions help your uterus return to its normal size. ? If you have any problems with breastfeeding, work with your health care provider or Science writer.  If you are not breastfeeding: ? Avoid touching your breasts a lot. Doing this can make your breasts produce more milk. ? Wear a good-fitting bra and use cold packs to help with swelling. ? Do not squeeze out (express) milk. This causes you to make more milk. Intimacy and sexuality  Ask your health care provider when you can engage in sexual activity. This may depend on: ? Your risk of infection. ? How fast you are  healing. ? Your comfort and desire to engage in sexual activity.  You are able to get pregnant after delivery, even if you have not had your period. If desired, talk with your health care provider about methods of birth control (contraception). Medicines  Take over-the-counter and prescription medicines only as told by your health care provider.  If you were prescribed an antibiotic medicine, take it as told by your health care provider. Do not stop taking the antibiotic even if you start to feel better. Activity  Gradually return to your normal activities as told by your health care provider. Ask your health care provider what activities are safe for you.  Rest as much as possible. Try to rest or take a nap while your baby is sleeping. Eating and drinking   Drink enough fluid to keep your urine pale yellow.  Eat high-fiber foods every day. These may help prevent or relieve constipation. High-fiber foods include: ? Whole grain cereals and breads. ? Brown rice. ? Beans. ? Fresh fruits and vegetables.  Do not try to lose weight quickly by cutting back on calories.  Take your prenatal vitamins until your postpartum checkup or until your health care provider tells you it is okay to stop. Lifestyle  Do not use any products that contain nicotine or tobacco, such as cigarettes and e-cigarettes. If you need help quitting, ask your health care provider.  Do not drink alcohol, especially if you are breastfeeding. General instructions  Keep all follow-up visits for you and your baby as told by your health care provider. Most women visit their health care provider for a postpartum checkup within the first 3-6 weeks after delivery. Contact a health care provider if:  You feel unable to cope with the changes that your child brings to your life, and these feelings do not go away.  You feel unusually sad or worried.  Your breasts become red, painful, or hard.  You have a fever.  You  have trouble holding urine or keeping urine from leaking.  You have little or no interest in activities you used to enjoy.  You have not breastfed at all and you have not had a menstrual period for 12 weeks after delivery.  You have stopped breastfeeding and you have not had a menstrual period for 12 weeks after you stopped breastfeeding.  You have questions about caring for yourself or your baby.  You pass a blood clot from your vagina. Get help right away if:  You have chest pain.  You have difficulty breathing.  You have sudden, severe leg pain.  You have severe pain or cramping in  your lower abdomen.  You bleed from your vagina so much that you fill more than one sanitary pad in one hour. Bleeding should not be heavier than your heaviest period.  You develop a severe headache.  You faint.  You have blurred vision or spots in your vision.  You have bad-smelling vaginal discharge.  You have thoughts about hurting yourself or your baby. If you ever feel like you may hurt yourself or others, or have thoughts about taking your own life, get help right away. You can go to the nearest emergency department or call:  Your local emergency services (911 in the [place]  A suicide crisis helpline, such as the [facility] at 660-299-2739. This is open 24 hours a day. Summary  The period of time right after you deliver your newborn up to 6-12 weeks after delivery is called the postpartum period.  Gradually return to your normal activities as told by your health care provider.  Keep all follow-up visits for you and your baby as told by your health care provider. This information is not intended to replace advice given to you by your health care provider. Make sure you discuss any questions you have with your health care provider. Document Released: 09/19/2007 Document Revised: 11/25/2017 Document Reviewed: 09/05/2017 Elsevier Patient Education  2020  [employer].

## 2019-08-18 NOTE — Progress Notes (Signed)
POSTPARTUM PROGRESS NOTE  Subjective: Kaylee Anderson is a 33 y.o. [id number] s/p NSVD at [redacted]w[redacted]d.  She reports she doing well. No acute events overnight. She denies any problems with ambulating, voiding or po intake. Denies nausea or vomiting. She has passed flatus. Pain is well controlled, some irritation from where her stitches are.  Lochia is decreasing significantly.  Objective: Blood pressure 115/75, pulse 73, temperature 97.7 F (36.5 C), temperature source Oral, resp. rate 18, height 5\' 8"  (1.727 m), weight 101.6 kg, last menstrual period 11/11/2018, unknown if currently breastfeeding.  Physical Exam:  General: alert, cooperative and no distress Chest: no respiratory distress Abdomen: soft, non-tender  Uterine Fundus: firm at U-1, appropriately tender Extremities: No calf swelling or tenderness  no edema  Recent Labs    08/17/19 0224 08/18/19 0451  HGB 9.1* 7.8*  HCT 28.6* 25.3*    Assessment/Plan: Kaylee Anderson is a 33 y.o. [id number] PPD#1 s/p NSVD at [redacted]w[redacted]d.  Routine Postpartum Care: Doing well, pain well-controlled.  -- Continue routine care, lactation support  -- Contraception: [facility]UD at [facility] visit -- Feeding: Breast -- PO iron initiated for anemia  Dispo: Plan for discharge later this afternoon if baby is cleared to go home.  Merilyn Baba, DO OB/GYN Fellow, [facility] for [facility]

## 2019-08-18 NOTE — Progress Notes (Signed)
Attending Circumcision Counseling Progress Note  Patient desires circumcision for her female infant.  Circumcision procedure details discussed, risks and benefits of procedure were also discussed.  Risks include bleeding , infection, injury of glans which may lead to penile deformity or urinary tract issues, unsatisfactory cosmetic appearance and other potential complications related to the procedure.  It was emphasized that this is an elective procedure.  Patient wants to proceed with circumcision; written informed consent obtained.  Routine circumcision and post circumcision care ordered for the infant.  Feliz Beam, M.D. Attending Center for [employer] (Faculty Practice)  08/18/2019 12:13 PM

## 2019-08-18 NOTE — Progress Notes (Signed)
CSW received consult for history of depression.  CSW met with MOB to offer support and complete assessment.    MOB sitting up in bed with FOB present at bedside and holding infant, when CSW entered the room. CSW introduced self and received verbal permission from MOB to complete assessment with FOB present. Both MOB and FOB welcoming of CSW visit and were pleasant and engaged throughout interaction. FOB did not contribute much to conversation but was very attentive and appropriate with infant. CSW inquired about MOB's mental health history and MOB acknowledged experiencing mild PPD with her first child who is now 6-years-old. MOB stated she was tired, disinterested in most things, kept to herself, was tearful and neglected basic self-care. MOB reported symptoms lasted for about 6 months before she "was able to to get into a groove". MOB denied any recent depression or anxiety but did acknowledge experiencing some anxiety about "normal stuff and COVID" but felt it was manageable. CSW provided education regarding the baby blues period vs. perinatal mood disorders, discussed treatment and gave resources for mental health follow up if concerns arise.  CSW recommends self-evaluation during the postpartum time period using the New Mom Checklist from Postpartum Progress and encouraged MOB to contact a medical professional if symptoms are noted at any time.  MOB did not appear to be displaying any acute mental health symptoms and denied any SI or HI. MOB reported having good support from husband/FOB, her brother, their family and their friends.   MOB confirmed having all essential items for infant once discharged and reported infant would be sleeping in a crib or bassinet once home. CSW provided review of Sudden Infant Death Syndrome (SIDS) precautions and safe sleeping habits.    CSW identifies no further need for intervention and no barriers to discharge at this time.  Kaylee Anderson, LCSWA  [facility] [phone]  

## 2019-08-22 ENCOUNTER — Encounter: Payer: Self-pay | Admitting: *Deleted

## 2019-08-23 ENCOUNTER — Encounter: Payer: BC Managed Care – PPO | Admitting: Obstetrics & Gynecology

## 2019-08-27 ENCOUNTER — Telehealth: Payer: Self-pay | Admitting: *Deleted

## 2019-08-27 NOTE — Telephone Encounter (Signed)
Left patient an URGENT message to call and schedule 4 week Postpartum appointment depending on birth control it can be virtual or in office.

## 2019-09-04 ENCOUNTER — Telehealth: Payer: Self-pay | Admitting: *Deleted

## 2019-09-04 NOTE — Telephone Encounter (Signed)
Left patient an URGENT message to call and schedule 4 week Postpartum appointment. Delivered on 08/17/2019.

## 2019-09-14 ENCOUNTER — Telehealth: Payer: BC Managed Care – PPO | Admitting: Nurse Practitioner

## 2019-09-14 DIAGNOSIS — B370 Candidal stomatitis: Secondary | ICD-10-CM | POA: Diagnosis not present

## 2019-09-14 MED ORDER — CLOTRIMAZOLE 10 MG MT TROC: 10.0000 mg | Freq: Every day | OROMUCOSAL | 0 refills | Status: DC

## 2019-09-14 NOTE — Progress Notes (Signed)
We are sorry that you are not feeling well.  Here is how we plan to help!  Based on what you have shared with me it does look like you have thrush or oral candiadisis.    Many patients with oropharyngeal candidiasis are asymptomatic. However, when symptoms do occur, patients commonly experience a cottony feeling in the mouth, loss of taste, and, in some cases, pain during eating and swallowing. Patients who have denture stomatitis usually experience pain  Wash your hands.   For most patients with mild oropharyngeal candidiasis, we suggest topical therapy for 7 to 14 days.. We prefer topical rather than oral therapy to reduce the risk of drug interactions and systemic toxicity. I am prescribing Clotrimazole 10mg  troche 5 times daily for 7 days. This medication should not cause any complications or problems with breastfeeding.  If this medication does not improve your symptoms, you will need to follow up with your PCP for further testing and evaluation.     HOME CARE:   Wash your hands frequently.  Do  Avoid kissing other people during this time.  Avoid sharing drinking glasses, eating utensils, or razors.  Do not handle contact lenses unless you have thoroughly washed your hands with soap and warm water!  Avoid oral sex during this time.    Avoid contact with anyone who has a weakened immune system.   GET HELP RIGHT AWAY IF:   Symptoms last longer than 10 days.  Your symptoms become worse.  MAKE SURE YOU:   Understand these instructions.  Will watch your condition.  Will get help right away if you are not doing well or get worse.    Your e-visit answers were reviewed by a board certified advanced clinical practitioner to complete your personal care plan.  Depending upon the condition, your plan could have  Included both over the counter or prescription medications.    Please review your pharmacy choice.  Be sure that the pharmacy you have chosen is open so that you can  pick up your prescription now.  If there is a problem you can message your provider in [place] to have the prescription routed to another pharmacy.    Your safety is important to [place].  If you have drug allergies check our prescription carefully.  For the next 24 hours you can use MyChart to ask questions about today's visit, request a non-urgent call back, or ask for a work or school excuse from your e-visit provider.  You will get an email in the next two days asking about your experience.  I hope that your e-visit has been valuable and will speed your recovery.  I have spent more than 5 minutes reviewing and documenting in the patient's chart.

## 2019-09-18 ENCOUNTER — Other Ambulatory Visit: Payer: Self-pay

## 2019-09-18 ENCOUNTER — Encounter: Payer: Self-pay | Admitting: Advanced Practice Midwife

## 2019-09-18 ENCOUNTER — Ambulatory Visit (INDEPENDENT_AMBULATORY_CARE_PROVIDER_SITE_OTHER): Payer: BC Managed Care – PPO | Admitting: Advanced Practice Midwife

## 2019-09-18 VITALS — BP 116/70 | HR 66 | Resp 16 | Ht 65.0 in | Wt 203.0 lb

## 2019-09-18 DIAGNOSIS — Z30430 Encounter for insertion of intrauterine contraceptive device: Secondary | ICD-10-CM | POA: Diagnosis not present

## 2019-09-18 DIAGNOSIS — Z3202 Encounter for pregnancy test, result negative: Secondary | ICD-10-CM

## 2019-09-18 DIAGNOSIS — B3789 Other sites of candidiasis: Secondary | ICD-10-CM

## 2019-09-18 LAB — POCT URINE PREGNANCY: Preg Test, Ur: NEGATIVE

## 2019-09-18 MED ORDER — FLUCONAZOLE 150 MG PO TABS: 150.0000 mg | ORAL_TABLET | ORAL | 2 refills | Status: DC

## 2019-09-18 MED ORDER — LEVONORGESTREL 20 MCG/24HR IU IUD
INTRAUTERINE_SYSTEM | Freq: Once | INTRAUTERINE | Status: AC
  Administered 2019-09-18: 11:00:00 via INTRAUTERINE

## 2019-09-18 NOTE — Patient Instructions (Addendum)
[email or web address] Dr. Letha Cape, Pediatrician, Breastfeeding Specialist     Intrauterine Device Insertion, Care After  This sheet gives you information about how to care for yourself after your procedure. Your health care provider may also give you more specific instructions. If you have problems or questions, contact your health care provider. What can I expect after the procedure? After the procedure, it is common to have:  Cramps and pain in the abdomen.  Light bleeding (spotting) or heavier bleeding that is like your menstrual period. This may last for up to a few days.  Lower back pain.  Dizziness.  Headaches.  Nausea. Follow these instructions at home:  Before resuming sexual activity, check to make sure that you can feel the IUD string(s). You should be able to feel the end of the string(s) below the opening of your cervix. If your IUD string is in place, you may resume sexual activity. ? If you had a hormonal IUD inserted more than 7 days after your most recent period started, you will need to use a backup method of birth control for 7 days after IUD insertion. Ask your health care provider whether this applies to you.  Continue to check that the IUD is still in place by feeling for the string(s) after every menstrual period, or once a month.  Take over-the-counter and prescription medicines only as told by your health care provider.  Do not drive or use heavy machinery while taking prescription pain medicine.  Keep all follow-up visits as told by your health care provider. This is important. Contact a health care provider if:  You have bleeding that is heavier or lasts longer than a normal menstrual cycle.  You have a fever.  You have cramps or abdominal pain that get worse or do not get better with medicine.  You develop abdominal pain that is new or is not in the same area of earlier cramping and pain.  You feel lightheaded or weak.  You have abnormal or  bad-smelling discharge from your vagina.  You have pain during sexual activity.  You have any of the following problems with your IUD string(s): ? The string bothers or hurts you or your sexual partner. ? You cannot feel the string. ? The string has gotten longer.  You can feel the IUD in your vagina.  You think you may be pregnant, or you miss your menstrual period.  You think you may have an STI (sexually transmitted infection). Get help right away if:  You have flu-like symptoms.  You have a fever and chills.  You can feel that your IUD has slipped out of place. Summary  After the procedure, it is common to have cramps and pain in the abdomen. It is also common to have light bleeding (spotting) or heavier bleeding that is like your menstrual period.  Continue to check that the IUD is still in place by feeling for the string(s) after every menstrual period, or once a month.  Keep all follow-up visits as told by your health care provider. This is important.  Contact your health care provider if you have problems with your IUD string(s), such as the string getting longer or bothering you or your sexual partner. This information is not intended to replace advice given to you by your health care provider. Make sure you discuss any questions you have with your health care provider. Document Released: 07/21/2011 Document Revised: 11/04/2017 Document Reviewed: 10/13/2016 Elsevier Patient Education  2020 [employer]  and Breastfeeding Thrush, also called candidiasis, is a fungal infection that can be passed between a mother and her baby during breastfeeding. It can cause nipple pain and sensitivity, and can cause symptoms in a baby, such as a rash or white patches in the mouth. If you are breastfeeding, you and your baby may need treatment at the same time in order to clear up the infection, even if one does not have symptoms. Occasionally, other family members, especially  your sexual partner, may need to be treated at the same time. What are the causes? This condition is caused by a sudden increase (overgrowth) of the Candida fungus. This fungus is normally present in small amounts in warm, dark, and moist places of the body, such as skin folds under the breast and wet nipples covered by bras or nursing bra pads. Normally, the fungus is kept at healthy levels by the natural bacteria in our bodies. When the body's natural balance of bacteria is altered, the fungus can grow and multiply quickly. What increases the risk? You are more likely to develop this condition if:  You or your baby has been taking antibiotic medicines.  Your nipples are cracked.  You are taking birth control pills (oral contraceptives).  You are taking medicines to reduce inflammation (steroids), such as asthma medicines.  You have had a previous yeast infection. What are the signs or symptoms? Symptoms of this condition include:  Breast pain during, between, or right after feedings.  Nipples that are: ? Sore. Soreness may start suddenly two weeks after giving birth. ? Sensitive. They may be painful even with a light touch. ? A deep pink or red color. They may have small blisters on them. ? Puffy and shiny. ? Leaky. ? Itchy. ? Cracked, scaly, or flaky. Your baby may have the following symptoms:  Bright red rash on the buttocks.  Sore-looking blisters or pimples (pustules) on the buttocks.  White patches on the tongue. The patches cannot be wiped off with a clean paper towel.  Fussiness.  Refusal to breastfeed. How is this diagnosed? This condition is diagnosed based on:  Your symptoms.  Culture tests. This is when samples of discharge from your breasts are grown and then checked under a microscope. How is this treated? This condition may be treated by:  Applying antifungal cream to your nipples after each feeding.  Medicine for you or your baby. Symptoms usually  improve within 24-48 hours after starting treatment. In some cases, symptoms may get worse before they get better. Make sure that you, your baby, and your sexual partner get checked for thrush and treated at the same time. Follow these instructions at home: Medicines  Take or use over-the-counter and prescription medicines, creams, and ointments only as told by your health care provider.  Give your child over-the-counter and prescription medicines only as told by his or her health care provider.  If you or your child were prescribed an antifungal medicine, apply it or give it as told by your health care provider. Do not stop using the medicine even if you or your child starts to feel better. Stopping the medicine early can cause symptoms to return.  If directed, take a probiotic supplement. Probiotics are the good bacteria and yeasts that live in your body and keep you and your digestive system healthy. General hygiene   Wash your hands often with hot, soapy water, and pat them dry. Wash them before and after nursing, after changing diapers, and after using the  bathroom.  Wash your baby's hands often, especially if he or she sucks on his or her fingers.  Before breastfeeding, wash your nipples with warm water. Let nipples air dry after washing and feeding.  If your baby uses a pacifier, rubber nipples, teethers, or mouth toys, boil them for 20 minutes a day and replace them every week.  Wash your breast pump and all its parts thoroughly in a solution of water and bleach. Boil all parts that touch milk (except the rubber gaskets).  Wear 100% cotton bras and wash them every day in hot water. Consider using bleach to kill fungus. Change bra pads after each feeding.  Use very hot water to wash any towels or clothing that has contact with infected areas. General instructions  Make sure that your baby is seen by a health care provider, and that you and your baby get treated at the same time.   Try nursing more often but for shorter periods of time. Start nursing on the least sore side.  If nursing becomes too painful, try temporarily pumping your milk instead. Do not save or freeze this milk, because giving it to your baby after treatment is done could cause the infection to return.  Eat yogurt that has active, live cultures. Contact a health care provider if:  You or your baby get worse or do not get better after 24-48 hours of treatment.  You take antibiotics and then your breasts develop shooting pains, discomfort, itching, or burning. Get help right away if:  You have a fever or other symptoms that do not improve or get worse.  You develop swelling and severe pain in your breast.  You develop blisters on your breast.  You feel a lump in your breast, with or without pain.  Your nipple starts bleeding. Summary  Ritta Slot is a fungal infection that can be passed between a mother and her baby during breastfeeding.  This condition may be treated with topical antifungal creams applied to the nipple after each feeding.  The spread of the infection can be controlled by washing hands, keeping your nipples clean and dry, and washing and sterilizing breast pumps, pacifiers, and other items that touch infected areas. This information is not intended to replace advice given to you by your health care provider. Make sure you discuss any questions you have with your health care provider. Document Released: 03/19/2005 Document Revised: 03/14/2019 Document Reviewed: 03/01/2017 Elsevier Patient Education  2020 [employer].

## 2019-09-18 NOTE — Progress Notes (Signed)
Post Partum Exam  Kaylee Anderson is a 33 y.o. [id number] female who presents for a postpartum visit. She is 4 weeks postpartum following a spontaneous vaginal delivery. I have fully reviewed the prenatal and intrapartum course. The delivery was at [redacted]w[redacted]d gestational weeks.  Anesthesia: none. Postpartum course has been complicated by thrush/breast candida in mom and baby. Pt reports significant, recurrent problems w/ thrush/breast candida w/ last baby and had great difficulty managing it. Nistatin and lifestyle changes did not work. Used Diflucan w. Kermit Balo result. Requesting Rx.  Baby's course has been unremarkable. Baby is feeding by breast. Bleeding staining only. Bowel function is normal. Bladder function is normal. Patient is not sexually active. Contraception method is planning IUD. Postpartum depression screening:neg  The following portions of the patient's history were reviewed and updated as appropriate: allergies, current medications, past family history, past medical history, past social history, past surgical history and problem list. Last pap smear done 2019 and was Normal, Neg HPV. Remote Hx HGSIL > 10 years ago. S/P. Cryo. Nml since then per pt.   Review of Systems Pertinent items are noted in HPI.    Objective:  Blood pressure 116/70, pulse 66, resp. rate 16, height 5\' 5"  (1.651 m), weight 203 lb (92.1 kg), last menstrual period 11/11/2018, currently breastfeeding.  General:  alert, cooperative, appears stated age, no distress and mildly obese   Breasts:  inspection negative, no nipple discharge or bleeding, no masses or nodularity palpable  Lungs: Nml rate and effort  Heart:  Regular rate  Abdomen: soft, non-tender; bowel sounds normal; no masses,  no organomegaly   Vulva:  normal  Vagina: normal vagina, no discharge, exudate, lesion, or erythema  Cervix:  anteverted, multiparous appearance and no cervical motion tenderness  Corpus: normal size, contour, consistency, mobility,  non-tender. Midline position  Adnexa:  no mass, fullness, tenderness  Rectal Exam: Not performed.        Patient identified, informed consent performed, signed copy in chart, time out was performed.  Urine pregnancy test negative. Denies IC since before delivery.   Speculum placed in the vagina.  Cervix visualized.  Cleaned with Hibiclens  x 2.  Grasped anteriourly/posteriorly with a single tooth tenaculum.  Uterus sounded to 8 cm.  Mirena IUD placed per manufacturer's recommendations.  Strings trimmed to 3 cm. Pt tolerated procedure well.  Patient given post procedure instructions and IUD care card with expiration date.  Patient is asked to check IUD strings periodically and declines string check. OTC IBU PRN.     Assessment:    Postpartum exam w/ Sx. Of breast candida. Pap smear not done at today's visit.  IUD placed   Plan:   1. Contraception: IUD 2. Rx Diflucan 3. Dr. Letha Cape Candida protocol given  4. Follow up in: 1 year or as needed.  5. IUD Post-insertion instructions reviewed.  Tamala Julian, [place], [place] 09/18/2019 11:48 AM

## 2019-09-27 ENCOUNTER — Other Ambulatory Visit: Payer: Self-pay

## 2019-09-27 DIAGNOSIS — Z20822 Contact with and (suspected) exposure to covid-19: Secondary | ICD-10-CM

## 2019-09-29 LAB — NOVEL CORONAVIRUS, NAA: SARS-CoV-2, NAA: NOT DETECTED

## 2019-10-01 ENCOUNTER — Encounter: Payer: Self-pay | Admitting: *Deleted

## 2019-12-17 ENCOUNTER — Telehealth: Payer: BC Managed Care – PPO

## 2019-12-28 ENCOUNTER — Ambulatory Visit (INDEPENDENT_AMBULATORY_CARE_PROVIDER_SITE_OTHER): Payer: BC Managed Care – PPO | Admitting: Sports Medicine

## 2019-12-28 ENCOUNTER — Encounter: Payer: Self-pay | Admitting: Sports Medicine

## 2019-12-28 ENCOUNTER — Other Ambulatory Visit: Payer: Self-pay

## 2019-12-28 DIAGNOSIS — R21 Rash and other nonspecific skin eruption: Secondary | ICD-10-CM | POA: Diagnosis not present

## 2019-12-28 DIAGNOSIS — M2550 Pain in unspecified joint: Secondary | ICD-10-CM

## 2019-12-28 MED ORDER — CLOBETASOL PROPIONATE 0.05 % EX OINT: 1.0000 "application " | TOPICAL_OINTMENT | Freq: Two times a day (BID) | CUTANEOUS | 0 refills | Status: AC

## 2019-12-28 NOTE — Progress Notes (Signed)
    Procedures performed today:    None.  Independent interpretation of tests performed by another provider:   Positive rheumatoid factor, negative CCP, elevated ESR and previous tests  Impression and Recommendations:    Polyarthralgia This pleasant 34 year old female continues to have arthralgias, this time over the left wrist in a de Quervain's pattern. We will do a thumb spica brace, de Quervain's rehab exercises, she can continue her ibuprofen and take 800 mg 3 times daily. I like to see her back in 2 weeks, first extensor compartment injection if no better. Of note she has seen a rheumatologist and is currently on Plaquenil.  Rash There is also a psoriatic appearing rash in her axillary and under her left breast. In light of her ongoing autoimmune problems I think the ultimate diagnosis will be psoriatic arthritis. I would certainly like dermatology to weigh in. Adding topical betazole.    ___________________________________________ Gwen Her. Dianah Field, M.D., ABFM., CAQSM. Primary Care and [place] Instructor of [place] [facility]

## 2019-12-28 NOTE — Assessment & Plan Note (Signed)
There is also a psoriatic appearing rash in her axillary and under her left breast. In light of her ongoing autoimmune problems I think the ultimate diagnosis will be psoriatic arthritis. I would certainly like dermatology to weigh in. Adding topical betazole.

## 2019-12-28 NOTE — Assessment & Plan Note (Addendum)
This pleasant 34 year old female continues to have arthralgias, this time over the left wrist in a de Quervain's pattern. We will do a thumb spica brace, de Quervain's rehab exercises, she can continue her ibuprofen and take 800 mg 3 times daily. I like to see her back in 2 weeks, first extensor compartment injection if no better. Of note she has seen a rheumatologist and is currently on Plaquenil.

## 2020-01-11 ENCOUNTER — Other Ambulatory Visit: Payer: Self-pay

## 2020-01-11 ENCOUNTER — Ambulatory Visit (INDEPENDENT_AMBULATORY_CARE_PROVIDER_SITE_OTHER): Payer: BC Managed Care – PPO | Admitting: Sports Medicine

## 2020-01-11 ENCOUNTER — Encounter: Payer: Self-pay | Admitting: Sports Medicine

## 2020-01-11 DIAGNOSIS — M2550 Pain in unspecified joint: Secondary | ICD-10-CM

## 2020-01-11 MED ORDER — TRAMADOL HCL 50 MG PO TABS: 50.0000 mg | ORAL_TABLET | Freq: Three times a day (TID) | ORAL | 0 refills | Status: DC | PRN

## 2020-01-11 NOTE — Patient Instructions (Signed)
Return to see me at least 1 month after second COVID-19 vaccination, establish care with [name], NP ASAP

## 2020-01-11 NOTE — Progress Notes (Signed)
    Procedures performed today:    None.  Independent interpretation of tests performed by another provider:   None.  Impression and Recommendations:    Polyarthralgia This pleasant 34 year old female returns, she has arthralgias, as well as pain over the left wrist consistent with de Quervain's tendinitis. At this point she has failed approximately 2 to 3 weeks of thumb spica brace, de Quervain's rehab exercises, acetaminophen. She is not taking Tylenol, she is seeing a rheumatologist and is currently on Plaquenil. My plan today was to do her first extensor compartment injection with ultrasound guidance however she is in the midst of her COVID-19 vaccination series so I would like to avoid any steroid that may blunt her immune response to the COVID-19 vaccination. I am going to add tramadol to keep her pain at bay in the meantime, and at least 1 month after her second vaccine I think it would be okay to do the injection, I would like her to establish care with [name], NP and I think it would be reasonable to check her COVID-19 IgG antibodies 1 month after her second vaccine before doing the injection.    ___________________________________________ Gwen Her. Dianah Field, M.D., ABFM., CAQSM. Primary Care and [place] Instructor of [place] [facility]

## 2020-01-11 NOTE — Assessment & Plan Note (Signed)
This pleasant 34 year old female returns, she has arthralgias, as well as pain over the left wrist consistent with de Quervain's tendinitis. At this point she has failed approximately 2 to 3 weeks of thumb spica brace, de Quervain's rehab exercises, acetaminophen. She is not taking Tylenol, she is seeing a rheumatologist and is currently on Plaquenil. My plan today was to do her first extensor compartment injection with ultrasound guidance however she is in the midst of her COVID-19 vaccination series so I would like to avoid any steroid that may blunt her immune response to the COVID-19 vaccination. I am going to add tramadol to keep her pain at bay in the meantime, and at least 1 month after her second vaccine I think it would be okay to do the injection, I would like her to establish care with [name], NP and I think it would be reasonable to check her COVID-19 IgG antibodies 1 month after her second vaccine before doing the injection.

## 2020-02-04 ENCOUNTER — Other Ambulatory Visit: Payer: Self-pay

## 2020-02-04 ENCOUNTER — Encounter: Payer: Self-pay | Admitting: Osteopathic Medicine

## 2020-02-04 ENCOUNTER — Ambulatory Visit (INDEPENDENT_AMBULATORY_CARE_PROVIDER_SITE_OTHER): Payer: BC Managed Care – PPO | Admitting: Osteopathic Medicine

## 2020-02-04 VITALS — BP 117/71 | HR 69 | Temp 98.3°F | Wt 192.0 lb

## 2020-02-04 DIAGNOSIS — Z0000 Encounter for general adult medical examination without abnormal findings: Secondary | ICD-10-CM | POA: Diagnosis not present

## 2020-02-04 DIAGNOSIS — M2550 Pain in unspecified joint: Secondary | ICD-10-CM | POA: Diagnosis not present

## 2020-02-04 MED ORDER — TRAMADOL HCL 50 MG PO TABS: 50.0000 mg | ORAL_TABLET | Freq: Three times a day (TID) | ORAL | 0 refills | Status: AC | PRN

## 2020-02-04 NOTE — Patient Instructions (Addendum)
General Preventive Care  Most recent routine screening lipids/other labs: due for cholesterol check   Everyone should have blood pressure checked once per year.   Tobacco: don't!   Alcohol: responsible moderation is ok for most adults - if you have concerns about your alcohol intake, please talk to me!   Exercise: as tolerated to reduce risk of cardiovascular disease and diabetes. Strength training will also prevent osteoporosis.   Mental health: if need for mental health care (medicines, counseling, other), or concerns about moods, please let me know!   Sexual health: if need for STD testing, or if concerns with libido/pain problems, please let me or OBGYN know! If you need to discuss your birth control options, please let me or OBGYN know!   Advanced Directive: Living Will and/or Healthcare Power of Attorney recommended for all adults, regardless of age or health.  Vaccines  Flu vaccine: recommended for almost everyone, every fall.   Shingles vaccine: Shingrix recommended after age 30.   Pneumonia vaccines: Prevnar and Pneumovax recommended after age 32, or sooner if certain medical conditions.  Tetanus booster: Tdap recommended every 10 years.   HPV vaccine: Gardasil recommended up to age 15 to prevent HPV-associated diseases, including certain cancers.  Cancer screenings   Colon cancer screening: recommended for everyone at age 54  Breast cancer screening: per OBGYN  Cervical cancer screening: Pap per OBGYN  Lung cancer screening: not needed for non-smokers  Infection screenings . HIV: done . Gonorrhea/Chlamydia: screening as needed . Hepatitis C: recommended for anyone born 59-1964 . TB: certain at-risk populations, or depending on work requirements and/or travel history Other . Bone Density Test: recommended for women at age 59

## 2020-02-04 NOTE — Progress Notes (Signed)
HPI: Kaylee Anderson is a 34 y.o. female who  has a past medical history of Abnormal Pap smear of cervix (2008), Dysthymia (06/21/2017), Fibromyalgia, HGSIL on cytologic smear of cervix, LGSIL on Pap smear of cervix, Multiple lipomas, PCOS (polycystic ovarian syndrome), Rheumatoid arthritis ([place]) (06/21/2017), and Trichomonal cervicitis.  she presents to [facility] today, 02/04/20,  for chief complaint of: Annual physica     Patient here for annual physical / wellness exam.  See preventive care reviewed as below.  Recent labs reviewed   Additional concerns today include: None except had to postpone injection d/t getting COVID vaccine, needs refill on pain Rx     Past medical, surgical, social and family history reviewed:  Patient Active Problem List   Diagnosis Date Noted  . Rash 12/28/2019  . Normal labor 08/17/2019  . Anemia during pregnancy 06/29/2019  . Supervision of normal pregnancy 01/15/2019  . History of postpartum hemorrhage, currently pregnant 01/15/2019  . Chronic fatigue syndrome with fibromyalgia 10/09/2018  . Rheumatoid factor positive 10/09/2018  . Shellfish allergy 07/03/2018  . History of multiple allergies 07/03/2018  . History of food anaphylaxis 07/03/2018  . Ear itching 07/03/2018  . Depression/fibromyalgia/chronic fatigue syndrome 12/16/2017  . Overweight (BMI 25.0-29.9) 11/24/2017  . Polyarthralgia 11/24/2017  . Family history of breast cancer gene mutation in first degree relative 06/21/2017  . Family history of cancer 12/08/2015    Past Surgical History:  Procedure Laterality Date  . CHOLECYSTECTOMY    . COLPOSCOPY    . KNEE ARTHROSCOPY Left 2002  . LIPOMA EXCISION    . PARTIAL GASTRECTOMY      Social History   Tobacco Use  . Smoking status: Never Smoker  . Smokeless tobacco: Never Used  Substance Use Topics  . Alcohol use: Not Currently    Alcohol/week: 0.0 standard drinks    Comment: occassional     Family History  Problem Relation Age of Onset  . Hypertension Mother   . Diabetes Mother   . Cancer Mother        breast  . Allergic rhinitis Mother   . Sarcoidosis Father   . Non-Hodgkin's lymphoma Father   . Hypertension Father   . Allergic rhinitis Father   . Food Allergy Father        shellfish allergy and iodine/contrast dye allergy  . Cancer Paternal Aunt 24       breast   . Allergic rhinitis Paternal Aunt   . Cancer Paternal Aunt 80       ovarian  . Cancer Maternal Aunt 48       breast  . Allergic rhinitis Maternal Aunt   . Cancer Maternal Aunt 50       breast and ovarian  . Cancer Maternal Aunt 49       breast  . Cancer Maternal Aunt 48       breast  . Gout Brother   . Allergic rhinitis Brother   . Rheum arthritis Maternal Grandmother   . Allergic rhinitis Sister   . Angioedema Neg Hx   . Asthma Neg Hx   . Eczema Neg Hx   . Immunodeficiency Neg Hx   . Urticaria Neg Hx      Current medication list and allergy/intolerance information reviewed:    Current Outpatient Medications  Medication Sig Dispense Refill  . acetaminophen (TYLENOL) 500 MG tablet Take 2 tablets (1,000 mg total) by mouth every 6 (six) hours as needed for moderate pain. 30 tablet 0  .  clobetasol ointment (TEMOVATE) [id number] % Apply 1 application topically 2 (two) times daily. 60 g 0  . cyclobenzaprine (FLEXERIL) 5 MG tablet Take 1-2 tablets (5-10 mg total) by mouth at bedtime. (Patient taking differently: Take 5-10 mg by mouth at bedtime as needed for muscle spasms. ) 60 tablet 0  . EPINEPHrine (EPIPEN 2-PAK) 0.3 mg/0.3 mL IJ SOAJ injection Inject 0.3 mLs (0.3 mg total) into the muscle once as needed for up to 1 dose. 2 Device 1  . hydroxychloroquine (PLAQUENIL) 200 MG tablet Take 200 mg by mouth 2 (two) times daily.    . traMADol (ULTRAM) 50 MG tablet Take 1-2 tablets (50-100 mg total) by mouth every 8 (eight) hours as needed for moderate pain. Maximum 6 tabs per day. 21 tablet 0   No current  facility-administered medications for this visit.    Allergies  Allergen Reactions  . Iodine Anaphylaxis  . Levaquin [Levofloxacin In D5w] Itching    Headaches, vomiting  . Sulfa Antibiotics Itching    Migraine, vomiting  . Shellfish Allergy Swelling  . Apple Itching      Review of Systems:  Constitutional:  No  fever, no chills, No recent illness, No unintentional weight changes. No significant fatigue.   HEENT: No  headache, no vision change, no hearing change, No sore throat, No  sinus pressure  Cardiac: No  chest pain, No  pressure, No palpitations, No  Orthopnea  Respiratory:  No  shortness of breath. No  Cough  Gastrointestinal: No  abdominal pain, No  nausea, No  vomiting,  No  blood in stool, No  diarrhea, No  constipation   Musculoskeletal: +myalgia/arthralgia  Skin: No  Rash, No other wounds/concerning lesions  Genitourinary: No  incontinence, No  abnormal genital bleeding, No abnormal genital discharge  Hem/Onc: No  easy bruising/bleeding, No  abnormal lymph node  Endocrine: No cold intolerance,  No heat intolerance. No polyuria/polydipsia/polyphagia   Neurologic: No  weakness, No  dizziness, No  slurred speech/focal weakness/facial droop  Psychiatric: No  concerns with depression, No  concerns with anxiety, No sleep problems, No mood problems  Exam:  BP 117/71   Pulse 69   Temp 98.3 F (36.8 C) (Oral)   Wt 192 lb (87.1 kg)   LMP 01/14/2020 (Exact Date)   SpO2 100% Comment: on RA  BMI 31.95 kg/m   Constitutional: VS see above. General Appearance: alert, well-developed, well-nourished, NAD  Eyes: Normal lids and conjunctive, non-icteric sclera  Ears, Nose, Mouth, Throat:  TM normal bilaterally.   Neck: No masses, trachea midline. No thyroid enlargement. No tenderness/mass appreciated. No lymphadenopathy  Respiratory: Normal respiratory effort. no wheeze, no rhonchi, no rales  Cardiovascular: S1/S2 normal, no murmur, no rub/gallop auscultated.  RRR. No lower extremity edema.   Gastrointestinal: Nontender, no masses. No hepatomegaly, no splenomegaly. No hernia appreciated. Bowel sounds normal. Rectal exam deferred.   Musculoskeletal: Gait normal. No clubbing/cyanosis of digits.   Neurological: Normal balance/coordination. No tremor. No cranial nerve deficit on limited exam. Motor and sensation intact and symmetric. Cerebellar reflexes intact.   Skin: warm, dry, intact. No rash/ulcer.   Psychiatric: Normal judgment/insight. Normal mood and affect. Oriented x3.    No results found for this or any previous visit (from the past 72 hour(s)).  No results found.      ASSESSMENT/PLAN: The primary encounter diagnosis was Annual physical exam. A diagnosis of Polyarthralgia was also pertinent to this visit.   Orders Placed This Encounter  Procedures  . Lipid panel  .  CBC  . COMPLETE METABOLIC PANEL WITH GFR    Meds ordered this encounter  Medications  . traMADol (ULTRAM) 50 MG tablet    Sig: Take 1-2 tablets (50-100 mg total) by mouth every 8 (eight) hours as needed for moderate pain. Maximum 6 tabs per day.    Dispense:  21 tablet    Refill:  0    Patient Instructions  General Preventive Care  Most recent routine screening lipids/other labs: due for cholesterol check   Everyone should have blood pressure checked once per year.   Tobacco: don't!   Alcohol: responsible moderation is ok for most adults - if you have concerns about your alcohol intake, please talk to me!   Exercise: as tolerated to reduce risk of cardiovascular disease and diabetes. Strength training will also prevent osteoporosis.   Mental health: if need for mental health care (medicines, counseling, other), or concerns about moods, please let me know!   Sexual health: if need for STD testing, or if concerns with libido/pain problems, please let me or OBGYN know! If you need to discuss your birth control options, please let me or OBGYN know!    Advanced Directive: Living Will and/or Healthcare Power of Attorney recommended for all adults, regardless of age or health.  Vaccines  Flu vaccine: recommended for almost everyone, every fall.   Shingles vaccine: Shingrix recommended after age 77.   Pneumonia vaccines: Prevnar and Pneumovax recommended after age 2, or sooner if certain medical conditions.  Tetanus booster: Tdap recommended every 10 years.   HPV vaccine: Gardasil recommended up to age 59 to prevent HPV-associated diseases, including certain cancers.  Cancer screenings   Colon cancer screening: recommended for everyone at age 66  Breast cancer screening: per OBGYN  Cervical cancer screening: Pap per OBGYN  Lung cancer screening: not needed for non-smokers  Infection screenings . HIV: done . Gonorrhea/Chlamydia: screening as needed . Hepatitis C: recommended for anyone born 40-1964 . TB: certain at-risk populations, or depending on work requirements and/or travel history Other . Bone Density Test: recommended for women at age 38        Visit summary with medication list and pertinent instructions was printed for patient to review. All questions at time of visit were answered - patient instructed to contact office with any additional concerns or updates. ER/RTC precautions were reviewed with the patient.     Please note: voice recognition software was used to produce this document, and typos may escape review. Please contact Dr. Sheppard Coil for any needed clarifications.     Follow-up plan: Return in about 1 year (around 02/03/2021) for [place] [facility] [place] (call week prior to visit for lab orders).

## 2020-02-09 LAB — LIPID PANEL
Cholesterol: 90 mg/dL (ref <200)
HDL: 47 mg/dL — ABNORMAL LOW (ref >=50)
LDL Cholesterol (Calc): 30 mg/dL (calc)
Non-HDL Cholesterol (Calc): 43 mg/dL (calc) (ref <130)
Total CHOL/HDL Ratio: 1.9 (calc) (ref <5.0)
Triglycerides: 52 mg/dL (ref <150)

## 2020-02-09 LAB — COMPLETE METABOLIC PANEL WITH GFR
AG Ratio: 1.4 (calc) (ref 1.0–2.5)
ALT: 5 U/L — ABNORMAL LOW (ref 6–29)
AST: 11 U/L (ref 10–30)
Albumin: 3.8 g/dL (ref 3.6–5.1)
Alkaline phosphatase (APISO): 120 U/L (ref 31–125)
BUN: 9 mg/dL (ref 7–25)
CO2: 28 mmol/L (ref 20–32)
Calcium: 8.7 mg/dL (ref 8.6–10.2)
Chloride: 104 mmol/L (ref 98–110)
Creat: 0.87 mg/dL (ref 0.50–1.10)
GFR, Est African American: 102 mL/min/{1.73_m2} (ref >=60)
GFR, Est Non African American: 88 mL/min/{1.73_m2} (ref >=60)
Globulin: 2.8 g/dL (calc) (ref 1.9–3.7)
Glucose, Bld: 74 mg/dL (ref 65–99)
Potassium: 3.9 mmol/L (ref 3.5–5.3)
Sodium: 140 mmol/L (ref 135–146)
Total Bilirubin: 0.7 mg/dL (ref 0.2–1.2)
Total Protein: 6.6 g/dL (ref 6.1–8.1)

## 2020-02-09 LAB — CBC
HCT: 33 % — ABNORMAL LOW (ref 35.0–45.0)
Hemoglobin: 10.9 g/dL — ABNORMAL LOW (ref 11.7–15.5)
MCH: 29.9 pg (ref 27.0–33.0)
MCHC: 33 g/dL (ref 32.0–36.0)
MCV: 90.4 fL (ref 80.0–100.0)
MPV: 9.7 fL (ref 7.5–12.5)
Platelets: 301 10*3/uL (ref 140–400)
RBC: 3.65 10*6/uL — ABNORMAL LOW (ref 3.80–5.10)
RDW: 12.3 % (ref 11.0–15.0)
WBC: 3.5 10*3/uL — ABNORMAL LOW (ref 3.8–10.8)

## 2020-03-10 ENCOUNTER — Other Ambulatory Visit: Payer: Self-pay

## 2020-03-10 ENCOUNTER — Encounter: Payer: Self-pay | Admitting: Sports Medicine

## 2020-03-10 ENCOUNTER — Ambulatory Visit (INDEPENDENT_AMBULATORY_CARE_PROVIDER_SITE_OTHER): Payer: BC Managed Care – PPO | Admitting: Sports Medicine

## 2020-03-10 DIAGNOSIS — Z91013 Allergy to seafood: Secondary | ICD-10-CM

## 2020-03-10 DIAGNOSIS — Z91018 Allergy to other foods: Secondary | ICD-10-CM | POA: Diagnosis not present

## 2020-03-10 DIAGNOSIS — Z889 Allergy status to unspecified drugs, medicaments and biological substances status: Secondary | ICD-10-CM

## 2020-03-10 DIAGNOSIS — M654 Radial styloid tenosynovitis [de Quervain]: Secondary | ICD-10-CM | POA: Diagnosis not present

## 2020-03-10 DIAGNOSIS — Z9189 Other specified personal risk factors, not elsewhere classified: Secondary | ICD-10-CM

## 2020-03-10 MED ORDER — EPINEPHRINE 0.3 MG/0.3ML IJ SOAJ: 0.3000 mg | Freq: Once | INTRAMUSCULAR | 0 refills | Status: AC | PRN

## 2020-03-10 NOTE — Assessment & Plan Note (Signed)
After failure of conservative measures as and now greater than 1 month post second COVID-19 vaccine we proceeded with a left first extensor compartment injection. Return to see me in 1 month.

## 2020-03-10 NOTE — Progress Notes (Signed)
    Procedures performed today:    Procedure: Real-time Ultrasound Guided injection of the left first extensor compartment Device: Samsung HS60  Verbal informed consent obtained.  Time-out conducted.  Noted no overlying erythema, induration, or other signs of local infection.  Skin prepped in a sterile fashion.  Local anesthesia: Topical Ethyl chloride.  With sterile technique and under real time ultrasound guidance:  Using a 25-gauge needle advanced between the abductor pollicis longus and the extensor pollicis brevis, I then injected 1 cc Kenalog 40, 1 cc lidocaine, 1 cc bupivacaine.   Completed without difficulty  Pain immediately resolved suggesting accurate placement of the medication.  Advised to call if fevers/chills, erythema, induration, drainage, or persistent bleeding.  Images permanently stored and available for review in the ultrasound unit.  Impression: Technically successful ultrasound guided injection.  Independent interpretation of notes and tests performed by another provider:   None.  Brief History, Exam, Impression, and Recommendations:    De Quervain's tenosynovitis, left After failure of conservative measures as and now greater than 1 month post second COVID-19 vaccine we proceeded with a left first extensor compartment injection. Return to see me in 1 month.    ___________________________________________ Gwen Her. Dianah Field, M.D., ABFM., CAQSM. Primary Care and [place] Instructor of [place] [employer] [facility] of Medicine

## 2020-04-07 ENCOUNTER — Other Ambulatory Visit: Payer: Self-pay | Admitting: Physician Assistant

## 2020-04-07 ENCOUNTER — Other Ambulatory Visit: Payer: Self-pay | Admitting: Osteopathic Medicine

## 2020-04-07 DIAGNOSIS — M2550 Pain in unspecified joint: Secondary | ICD-10-CM

## 2020-04-07 DIAGNOSIS — M797 Fibromyalgia: Secondary | ICD-10-CM

## 2020-04-11 ENCOUNTER — Ambulatory Visit: Payer: BC Managed Care – PPO | Admitting: Sports Medicine

## 2020-04-19 ENCOUNTER — Ambulatory Visit (INDEPENDENT_AMBULATORY_CARE_PROVIDER_SITE_OTHER)
Admission: RE | Admit: 2020-04-19 | Discharge: 2020-04-19 | Disposition: A | Discharge status: Home / Self Care | Payer: BC Managed Care – PPO | Source: Ambulatory Visit

## 2020-04-19 DIAGNOSIS — J0100 Acute maxillary sinusitis, unspecified: Secondary | ICD-10-CM | POA: Diagnosis not present

## 2020-04-19 MED ORDER — AMOXICILLIN-POT CLAVULANATE 875-125 MG PO TABS: 1.0000 | ORAL_TABLET | Freq: Two times a day (BID) | ORAL | 0 refills | Status: AC

## 2020-04-19 NOTE — ED Provider Notes (Signed)
[place] Virtual Visit via Video Note:  Kaylee Anderson  initiated request for Telemedicine visit with [facility] Urgent Care team. I connected with Kaylee Anderson  on 04/19/2020 at 9:05 AM  for a synchronized telemedicine visit using a video enabled HIPPA compliant telemedicine application. I verified that I am speaking with Kaylee Anderson  using two identifiers. Lestine Box, PA-C  was physically located in a [place] [facility]gent care site and [name] was located at a different location.   The limitations of evaluation and management by telemedicine as well as the availability of in-person appointments were discussed. Patient was informed that she  may incur a bill ( including co-pay) for this virtual visit encounter. Kaylee Anderson  expressed understanding and gave verbal consent to proceed with virtual visit.   Kaylee Anderson 04/19/20 Arrival Time: Kaylee Anderson  CC: Sinus issue  SUBJECTIVE: History from: patient.  Kaylee Anderson is a 34 y.o. female who presents with maxillary sinus pain and pressure x 2 weeks.  Denies to sick exposure or precipitating event.  Has tried OTC mucinex without relief.  Symptoms are made worse in the morning.  Reports previous symptoms in the past with sinus infection in the past.   Complains of associated congestion.  Denies fever, chills, fatigue,  rhinorrhea, sore throat, SOB, wheezing, chest pain, nausea, changes in bowel or bladder habits.    ROS: As per HPI.  All other pertinent ROS negative.     Past Medical History:  Diagnosis Date  . Abnormal Pap smear of cervix 2008   cryo  . Dysthymia 06/21/2017  . Fibromyalgia   . HGSIL on cytologic smear of cervix    s/p colposcopy, most recent pap normal  . LGSIL on Pap smear of cervix   . Multiple lipomas   . PCOS (polycystic ovarian syndrome)   . Rheumatoid arthritis ([place]) 06/21/2017  . Trichomonal cervicitis    Past Surgical History:  Procedure Laterality Date  . CHOLECYSTECTOMY    . COLPOSCOPY      . KNEE ARTHROSCOPY Left 2002  . LIPOMA EXCISION    . PARTIAL GASTRECTOMY     Allergies  Allergen Reactions  . Iodine Anaphylaxis  . Levaquin [Levofloxacin In D5w] Itching    Headaches, vomiting  . Sulfa Antibiotics Itching    Migraine, vomiting  . Shellfish Allergy Swelling  . Apple Itching   No current facility-administered medications on file prior to encounter.   Current Outpatient Medications on File Prior to Encounter  Medication Sig Dispense Refill  . acetaminophen (TYLENOL) 500 MG tablet Take 2 tablets (1,000 mg total) by mouth every 6 (six) hours as needed for moderate pain. 30 tablet 0  . clobetasol ointment (TEMOVATE) [id number] % Apply 1 application topically 2 (two) times daily. 60 g 0  . cyclobenzaprine (FLEXERIL) 5 MG tablet Take 1-2 tablets (5-10 mg total) by mouth at bedtime. (Patient taking differently: Take 5-10 mg by mouth at bedtime as needed for muscle spasms. ) 60 tablet 0  . EPINEPHrine (EPIPEN 2-PAK) 0.3 mg/0.3 mL IJ SOAJ injection Inject 0.3 mLs (0.3 mg total) into the muscle once as needed for up to 1 dose. 2 each 0  . hydroxychloroquine (PLAQUENIL) 200 MG tablet Take 200 mg by mouth 2 (two) times daily.    . traMADol (ULTRAM) 50 MG tablet Take 1-2 tablets (50-100 mg total) by mouth every 8 (eight) hours as needed for moderate pain. Maximum 6 tabs per day. 21 tablet 0    OBJECTIVE:  There were no  vitals filed for this visit.  General appearance: alert; no distress Eyes: EOMI grossly HENT: normocephalic; atraumatic; localized symptoms to maxillary sinuses Neck: supple with FROM Lungs: normal respiratory effort; speaking in full sentences without difficulty Extremities: moves extremities without difficulty Skin: No obvious rashes Neurologic: No facial asymmetries Psychological: alert and cooperative; normal mood and affect  ASSESSMENT & PLAN:  1. Acute non-recurrent maxillary sinusitis     Meds ordered this encounter  Medications  .  amoxicillin-clavulanate (AUGMENTIN) 875-125 MG tablet    Sig: Take 1 tablet by mouth every 12 (twelve) hours for 10 days.    Dispense:  20 tablet    Refill:  0    Order Specific Question:   Supervising Provider    Answer:   Raylene Everts [id number]   Rest and push fluids Use OTC antihistamine and nasal spray for symptomatic relief Augmentin prescribed.  Take as directed and to completion Use OTC ibuprofen/tylenol as needed for pain Follow up with PCP as needed Follow up in person or go to the ED if you have any new or worsening symptoms such as fever, chills, worsening sinus pain/pressure, cough, sore throat, chest pain, shortness of breath, abdominal pain, changes in bowel or bladder habits, etc...   I discussed the assessment and treatment plan with the patient. The patient was provided an opportunity to ask questions and all were answered. The patient agreed with the plan and demonstrated an understanding of the instructions.   The patient was advised to call back or seek an in-person evaluation if the symptoms worsen or if the condition fails to improve as anticipated.  I provided 7 minutes of non-face-to-face time during this encounter.  Lestine Box, PA-C  04/19/2020 9:05 AM          Lestine Box, PA-C 04/19/20 [phone]

## 2020-04-19 NOTE — Discharge Instructions (Signed)
Rest and push fluids Use OTC antihistamine and nasal spray for symptomatic relief Augmentin prescribed.  Take as directed and to completion Use OTC ibuprofen/tylenol as needed for pain Follow up with PCP as needed Follow up in person or go to the ED if you have any new or worsening symptoms such as fever, chills, worsening sinus pain/pressure, cough, sore throat, chest pain, shortness of breath, abdominal pain, changes in bowel or bladder habits, etc..Marland Kitchen

## 2020-05-16 ENCOUNTER — Other Ambulatory Visit: Payer: Self-pay

## 2020-05-16 ENCOUNTER — Emergency Department
Admission: RE | Admit: 2020-05-16 | Discharge: 2020-05-16 | Disposition: A | Discharge status: Home / Self Care | Payer: BC Managed Care – PPO | Source: Ambulatory Visit | Attending: Family Medicine | Admitting: Family Medicine

## 2020-05-16 VITALS — BP 132/84 | HR 81 | Temp 99.2°F | Resp 17 | Ht 68.0 in | Wt 185.0 lb

## 2020-05-16 DIAGNOSIS — M545 Low back pain, unspecified: Secondary | ICD-10-CM

## 2020-05-16 LAB — POCT URINALYSIS DIP (MANUAL ENTRY)
Blood, UA: NEGATIVE
Glucose, UA: NEGATIVE mg/dL
Ketones, POC UA: NEGATIVE mg/dL
Leukocytes, UA: NEGATIVE
Nitrite, UA: NEGATIVE
Protein Ur, POC: 30 mg/dL — AB
Spec Grav, UA: 1.025 (ref 1.010–1.025)
Urobilinogen, UA: 1 E.U./dL
pH, UA: 6.5 (ref 5.0–8.0)

## 2020-05-16 MED ORDER — PREDNISONE 20 MG PO TABS: ORAL_TABLET | ORAL | 0 refills | Status: AC

## 2020-05-16 NOTE — ED Triage Notes (Signed)
Pt c/o lower back  Pain - similar symptoms w/ previous UTI - ongoing for 2 weeks Recently on Augmentin for sinus infection 3 weeks Denies possible pregnancy  No hx of kidney stones No OTC meds Covid vaccine Moderna (2nd shot 3/21)

## 2020-05-16 NOTE — ED Provider Notes (Signed)
Kaylee Anderson CARE    CSN: [id number] Arrival date & time: 05/16/20  1202      History   Chief Complaint Chief Complaint  Patient presents with   Appointment   Dysuria    HPI Kaylee Anderson is a 34 y.o. female.   Patient complains of onset of vague dull bilateral lower back pain about 2 weeks ago.  She states that she has a history of occasional UTI's that only cause low back pain without dysuria, frequency, hesitancy etc.  She states that she also has fibromyalgia and RA that occasionally flares.  She feels well otherwise without nausea/vomiting, abdominal/pelvic pain, and fevers, chills, and sweats.  Patient's last menstrual period was 04/05/2020 (exact date).  She denies possibility of pregnancy.   The history is provided by the patient.  Back Pain Location:  Lumbar spine Quality:  Aching Radiates to:  Does not radiate Pain severity:  Mild Pain is:  Same all the time Onset quality:  Gradual Duration:  2 weeks Timing:  Constant Progression:  Unchanged Chronicity:  Recurrent Context: not falling, not lifting heavy objects, not MCA, not physical stress, not recent illness and not recent injury   Worsened by:  Nothing Ineffective treatments:  None tried Associated symptoms: no abdominal pain, no abdominal swelling, no bladder incontinence, no bowel incontinence, no dysuria, no fever, no leg pain, no numbness, no paresthesias, no pelvic pain, no perianal numbness, no tingling and no weakness     Past Medical History:  Diagnosis Date   Abnormal Pap smear of cervix 2008   cryo   Dysthymia 06/21/2017   Fibromyalgia    HGSIL on cytologic smear of cervix    s/p colposcopy, most recent pap normal   LGSIL on Pap smear of cervix    Multiple lipomas    PCOS (polycystic ovarian syndrome)    Rheumatoid arthritis ([place]) 06/21/2017   Trichomonal cervicitis     Patient Active Problem List   Diagnosis Date Noted   De Quervain's tenosynovitis, left 03/10/2020    Rash 12/28/2019   Normal labor 08/17/2019   Anemia during pregnancy 06/29/2019   Supervision of normal pregnancy 01/15/2019   History of postpartum hemorrhage, currently pregnant 01/15/2019   Chronic fatigue syndrome with fibromyalgia 10/09/2018   Rheumatoid factor positive 10/09/2018   Shellfish allergy 07/03/2018   History of multiple allergies 07/03/2018   History of food anaphylaxis 07/03/2018   Ear itching 07/03/2018   Depression/fibromyalgia/chronic fatigue syndrome 12/16/2017   Overweight (BMI 25.0-29.9) 11/24/2017   Polyarthralgia 11/24/2017   Family history of breast cancer gene mutation in first degree relative 06/21/2017   Family history of cancer 12/08/2015    Past Surgical History:  Procedure Laterality Date   CHOLECYSTECTOMY     COLPOSCOPY     KNEE ARTHROSCOPY Left 2002   LIPOMA EXCISION     PARTIAL GASTRECTOMY      OB History    Gravida  2   Para  2   Term  2   Preterm      AB      Living  2     SAB      TAB      Ectopic      Multiple  0   Live Births  1            Home Medications    Prior to Admission medications   Medication Sig Start Date End Date Taking? Authorizing Provider  hydroxychloroquine (PLAQUENIL) 200 MG tablet Take 200 mg by  mouth 2 (two) times daily. 12/13/19  Yes [provider]  acetaminophen (TYLENOL) 500 MG tablet Take 2 tablets (1,000 mg total) by mouth every 6 (six) hours as needed for moderate pain. 03/23/19   Trixie Dredge, PA-C  clobetasol ointment (TEMOVATE) 3.82 % Apply 1 application topically 2 (two) times daily. 12/28/19   Silverio Decamp, MD  cyclobenzaprine (FLEXERIL) 5 MG tablet Take 1-2 tablets (5-10 mg total) by mouth at bedtime. Patient taking differently: Take 5-10 mg by mouth at bedtime as needed for muscle spasms.  03/23/19   Trixie Dredge, PA-C  EPINEPHrine (EPIPEN 2-PAK) 0.3 mg/0.3 mL IJ SOAJ injection Inject 0.3 mLs (0.3 mg total) into  the muscle once as needed for up to 1 dose. 03/10/20   Silverio Decamp, MD  predniSONE (DELTASONE) 20 MG tablet Take one tab by mouth twice daily for 4 days, then one daily. Take with food. 05/16/20   Kandra Nicolas, MD  traMADol (ULTRAM) 50 MG tablet Take 1-2 tablets (50-100 mg total) by mouth every 8 (eight) hours as needed for moderate pain. Maximum 6 tabs per day. 02/04/20   Emeterio Reeve, DO    Family History Family History  Problem Relation Age of Onset   Hypertension Mother    Diabetes Mother    Cancer Mother        breast   Allergic rhinitis Mother    Sarcoidosis Father    Non-Hodgkin's lymphoma Father    Hypertension Father    Allergic rhinitis Father    Food Allergy Father        shellfish allergy and iodine/contrast dye allergy   Cancer Paternal Aunt 25       breast    Allergic rhinitis Paternal Aunt    Cancer Paternal Aunt 58       ovarian   Cancer Maternal Aunt 48       breast   Allergic rhinitis Maternal Aunt    Cancer Maternal Aunt 50       breast and ovarian   Cancer Maternal Aunt 49       breast   Cancer Maternal Aunt 48       breast   Gout Brother    Allergic rhinitis Brother    Rheum arthritis Maternal Grandmother    Allergic rhinitis Sister    Angioedema Neg Hx    Asthma Neg Hx    Eczema Neg Hx    Immunodeficiency Neg Hx    Urticaria Neg Hx     Social History Social History   Tobacco Use   Smoking status: Never Smoker   Smokeless tobacco: Never Used  Scientific laboratory technician Use: Never used  Substance Use Topics   Alcohol use: Not Currently    Alcohol/week: 0.0 standard drinks    Comment: occassional   Drug use: No     Allergies   Iodine, Levaquin [levofloxacin in d5w], Sulfa antibiotics, Shellfish allergy, and Apple   Review of Systems Review of Systems  Constitutional: Negative for chills, diaphoresis, fatigue and fever.  Gastrointestinal: Negative for abdominal pain and bowel incontinence.    Genitourinary: Negative for bladder incontinence, difficulty urinating, dysuria, flank pain, frequency, hematuria, pelvic pain, urgency and vaginal discharge.  Musculoskeletal: Positive for back pain.  Neurological: Negative for tingling, weakness, numbness and paresthesias.  All other systems reviewed and are negative.    Physical Exam Triage Vital Signs ED Triage Vitals  Enc Vitals Group     BP 05/16/20 1222 132/84  Pulse Rate 05/16/20 1222 81     Resp 05/16/20 1222 17     Temp 05/16/20 1222 99.2 F (37.3 C)     Temp Source 05/16/20 1222 Oral     SpO2 05/16/20 1222 100 %     Weight 05/16/20 1226 185 lb (83.9 kg)     Height 05/16/20 1226 5\' 8"  (1.727 m)     Head Circumference --      Peak Flow --      Pain Score 05/16/20 1225 4     Pain Loc --      Pain Edu? --      Excl. in [place]? --    No data found.  Updated Vital Signs BP 132/84 (BP Location: Left Arm)    Pulse 81    Temp 99.2 F (37.3 C) (Oral)    Resp 17    Ht 5\' 8"  (1.727 m)    Wt 83.9 kg    LMP 04/05/2020 (Exact Date) Comment: irreg due to IUD   SpO2 100%    BMI 28.13 kg/m   Visual Acuity Right Eye Distance:   Left Eye Distance:   Bilateral Distance:    Right Eye Near:   Left Eye Near:    Bilateral Near:     Physical Exam Nursing notes and Vital Signs reviewed. Appearance:  Patient appears stated age, and in no acute distress.    Eyes:  Pupils are equal, round, and reactive to light and accomodation.  Extraocular movement is intact.  Conjunctivae are not inflamed   Pharynx:  Normal; moist mucous membranes  Neck:  Supple.  No adenopathy Lungs:  Clear to auscultation.  Breath sounds are equal.  Moving air well. Heart:  Regular rate and rhythm without murmurs, rubs, or gallops.  Abdomen:  Nontender without masses or hepatosplenomegaly.  Bowel sounds are present.  No CVA or flank tenderness.  Back:  Vague lower back tenderness to palpation. Extremities:  No edema.  Skin:  No rash present.     UC  Treatments / Results  Labs (all labs ordered are listed, but only abnormal results are displayed) Labs Reviewed  POCT URINALYSIS DIP (MANUAL ENTRY) - Abnormal; Notable for the following components:      Result Value   Color, UA other (*)    Bilirubin, UA small (*)    Protein Ur, POC =30 (*)    All other components within normal limits  URINE CULTURE    EKG   Radiology No results found.  Procedures Procedures (including critical care time)  Medications Ordered in UC Medications - No data to display  Initial Impression / Assessment and Plan / UC Course  I have reviewed the triage vital signs and the nursing notes.  Pertinent labs & imaging results that were available during my care of the patient were reviewed by me and considered in my medical decision making (see chart for details).    Note unremarkable urinalysis.  Urine culture pending. Suspect that patient's back pain a result of recurrent fibromyalgia.  Begin prednisone burst/taper. Followup with rheumatologist.   Final Clinical Impressions(s) / UC Diagnoses   Final diagnoses:  Acute bilateral low back pain without sciatica     Discharge Instructions     Increase fluid intake.    ED Prescriptions    Medication Sig Dispense Auth. Provider   predniSONE (DELTASONE) 20 MG tablet Take one tab by mouth twice daily for 4 days, then one daily. Take with food. 12 tablet Kandra Nicolas,  MD        Kandra Nicolas, MD 05/17/20 1326

## 2020-05-16 NOTE — Discharge Instructions (Addendum)
Increase fluid intake.

## 2020-05-17 LAB — URINE CULTURE
MICRO NUMBER:: 10581312
Result:: NO GROWTH
SPECIMEN QUALITY:: ADEQUATE

## 2020-07-18 ENCOUNTER — Ambulatory Visit (INDEPENDENT_AMBULATORY_CARE_PROVIDER_SITE_OTHER): Payer: BC Managed Care – PPO | Admitting: Osteopathic Medicine

## 2020-07-18 ENCOUNTER — Other Ambulatory Visit: Payer: Self-pay

## 2020-07-18 ENCOUNTER — Ambulatory Visit (INDEPENDENT_AMBULATORY_CARE_PROVIDER_SITE_OTHER): Payer: BC Managed Care – PPO

## 2020-07-18 ENCOUNTER — Encounter: Payer: Self-pay | Admitting: Osteopathic Medicine

## 2020-07-18 VITALS — BP 117/78 | HR 75 | Wt 182.0 lb

## 2020-07-18 DIAGNOSIS — M5442 Lumbago with sciatica, left side: Secondary | ICD-10-CM

## 2020-07-18 DIAGNOSIS — N63 Unspecified lump in unspecified breast: Secondary | ICD-10-CM

## 2020-07-18 DIAGNOSIS — M25551 Pain in right hip: Secondary | ICD-10-CM | POA: Diagnosis not present

## 2020-07-18 DIAGNOSIS — G8929 Other chronic pain: Secondary | ICD-10-CM

## 2020-07-18 DIAGNOSIS — M259 Joint disorder, unspecified: Secondary | ICD-10-CM

## 2020-07-18 DIAGNOSIS — M797 Fibromyalgia: Secondary | ICD-10-CM | POA: Diagnosis not present

## 2020-07-18 MED ORDER — CYCLOBENZAPRINE HCL 5 MG PO TABS: 5.0000 mg | ORAL_TABLET | Freq: Three times a day (TID) | ORAL | 1 refills | Status: AC | PRN

## 2020-07-18 NOTE — Patient Instructions (Signed)
Should get call form [facility] to schedule Mammogram and Ultrasound. Radiologist might discuss option for MRI breast for screening given your family history   Xrays today for lower back, hip, SI joint. Please schedule follow-up with Dr T for these issues also.

## 2020-07-18 NOTE — Progress Notes (Signed)
Kaylee Anderson is a 34 y.o. female who presents to  [facility]  today, 07/18/20, seeking care for the following:  . Breast lump -patient reports noting round breast lump, 1st noted about 2 months ago, now seems to be getting a bit larger, on the right breast close to the sternum.  Concerned given family history of breast cancer in mother, who was diagnosed initially in her 40s, and has also experienced a recurrence since then.  On exam, I cannot appreciate any concerning findings other than fibrocystic breast tissue, no skin changes or nipple discharge, normal axillary tissue . R hip pain -right hip/SI joint pain, as well as lower back pain with left extremity numbness when she leans toward the left while sitting.  Chronic fibromyalgia with ongoing flare since this spring, but she also fell recently going down some stairs and landed on her buttocks      [place] with other pertinent findings:  The primary encounter diagnosis was Breast lump. Diagnoses of Fibromyalgia, Disorder of left sacroiliac joint, Right hip pain, and Chronic bilateral low back pain with left-sided sciatica were also pertinent to this visit.   No results found for this or any previous visit (from the past 24 hour(s)).     Patient Instructions  Should get call form [facility] to schedule Mammogram and Ultrasound. Radiologist might discuss option for MRI breast for screening given your family history   Xrays today for lower back, hip, SI joint. Please schedule follow-up with Dr T for these issues also.      Orders Placed This Encounter  Procedures  . MM Digital Diagnostic Bilat  . US BREAST LTD UNI LEFT INC AXILLA  . US BREAST LTD UNI RIGHT INC AXILLA  . DG Hip Unilat W OR W/O Pelvis 2-3 Views Right  . DG Si Joints  . DG Lumbar Spine Complete    Meds ordered this encounter  Medications  . cyclobenzaprine (FLEXERIL) 5 MG tablet     Sig: Take 1 tablet (5 mg total) by mouth 3 (three) times daily as needed for muscle spasms.    Dispense:  30 tablet    Refill:  1       Follow-up instructions: Return for RECHECK PENDING RESULTS / IF WORSE OR CHANGE, VISIT WITH [facility] FOR ORTHOPEDIC ISSUE.                                         BP 117/78 (BP Location: Left Arm, Patient Position: Sitting)   Pulse 75   Wt 182 lb (82.6 kg)   SpO2 99%   BMI 27.67 kg/m   Current Meds  Medication Sig  . acetaminophen (TYLENOL) 500 MG tablet Take 2 tablets (1,000 mg total) by mouth every 6 (six) hours as needed for moderate pain.  . clobetasol ointment (TEMOVATE) 3.71 % Apply 1 application topically 2 (two) times daily.  Marland Kitchen EPINEPHrine (EPIPEN 2-PAK) 0.3 mg/0.3 mL IJ SOAJ injection Inject 0.3 mLs (0.3 mg total) into the muscle once as needed for up to 1 dose.  . hydroxychloroquine (PLAQUENIL) 200 MG tablet Take 200 mg by mouth 2 (two) times daily.    No results found for this or any previous visit (from the past 72 hour(s)).  No results found.     All questions at time of visit were answered -  patient instructed to contact office with any additional concerns or updates.  ER/RTC precautions were reviewed with the patient as applicable.   Please note: voice recognition software was used to produce this document, and typos may escape review. Please contact Dr. Sheppard Coil for any needed clarifications.

## 2020-07-23 ENCOUNTER — Other Ambulatory Visit: Payer: Self-pay | Admitting: Osteopathic Medicine

## 2020-07-23 DIAGNOSIS — N63 Unspecified lump in unspecified breast: Secondary | ICD-10-CM

## 2020-08-08 ENCOUNTER — Ambulatory Visit: Payer: BC Managed Care – PPO

## 2020-08-08 ENCOUNTER — Ambulatory Visit
Admission: RE | Admit: 2020-08-08 | Discharge: 2020-08-08 | Disposition: A | Discharge status: Home / Self Care | Payer: BC Managed Care – PPO | Source: Ambulatory Visit | Attending: Osteopathic Medicine | Admitting: Osteopathic Medicine

## 2020-08-08 ENCOUNTER — Other Ambulatory Visit: Payer: Self-pay

## 2021-03-30 IMAGING — US US MFM OB FOLLOW UP
1 series · 14 of 28 positions shown · non-contrast
Comparison: none

[Series 1: us mfm ob follow up · 14 of 28 slices shown]
[im 2/28]
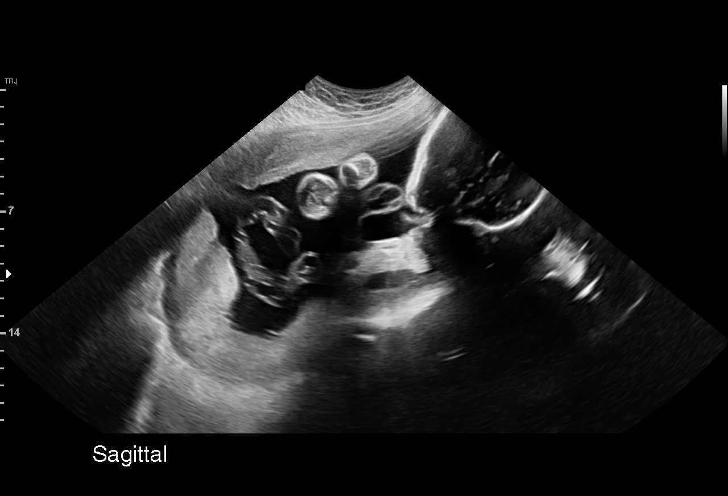
[im 4/28]
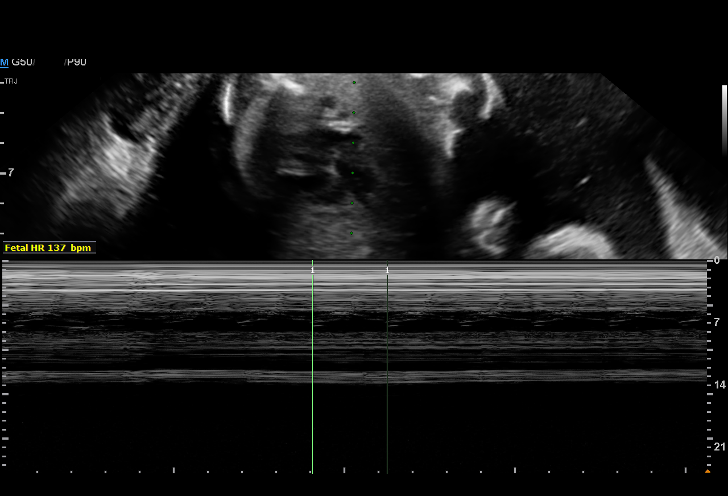
[im 6/28]
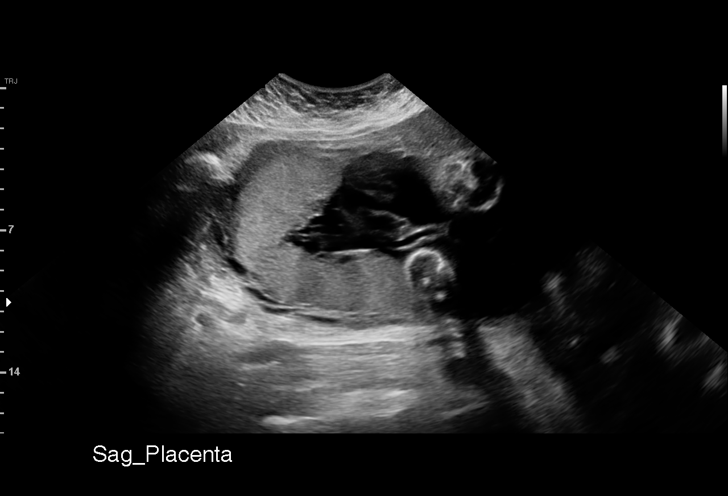
[im 8/28]
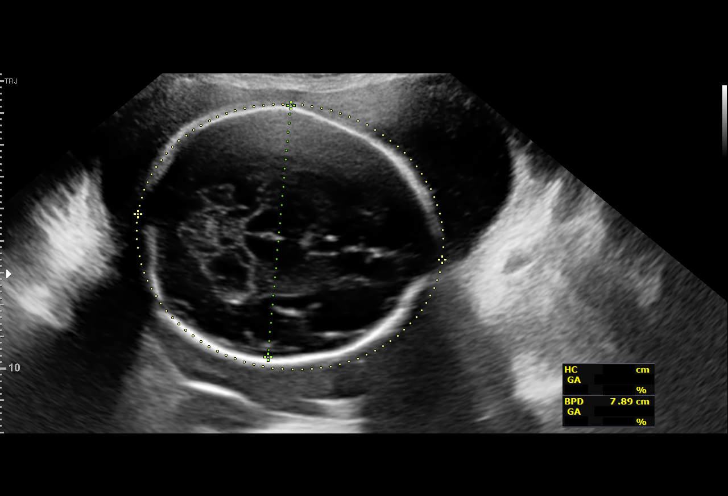
[im 10/28]
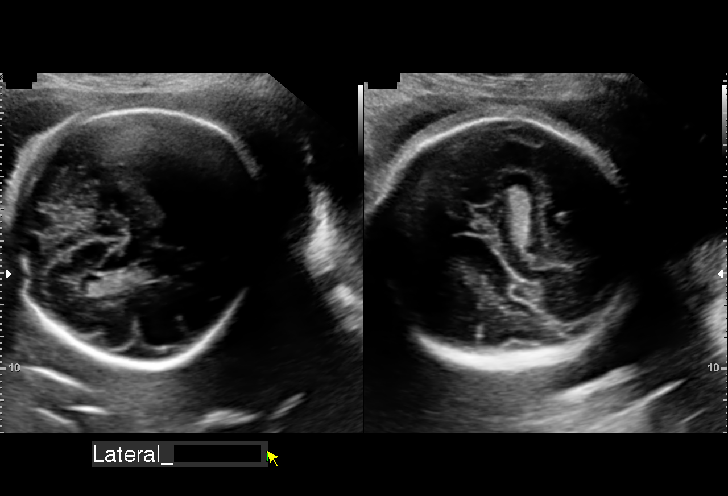
[im 12/28]
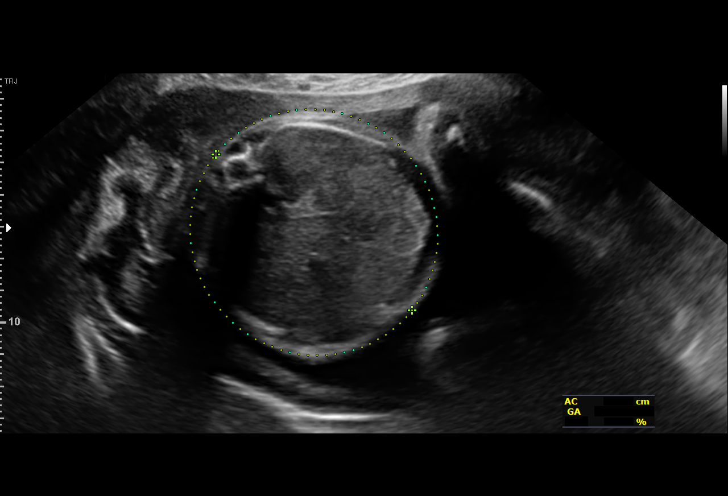
[im 14/28]
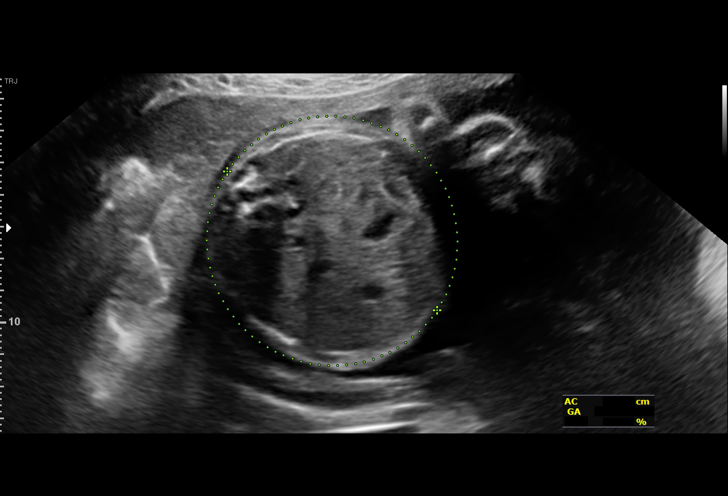
[im 16/28]
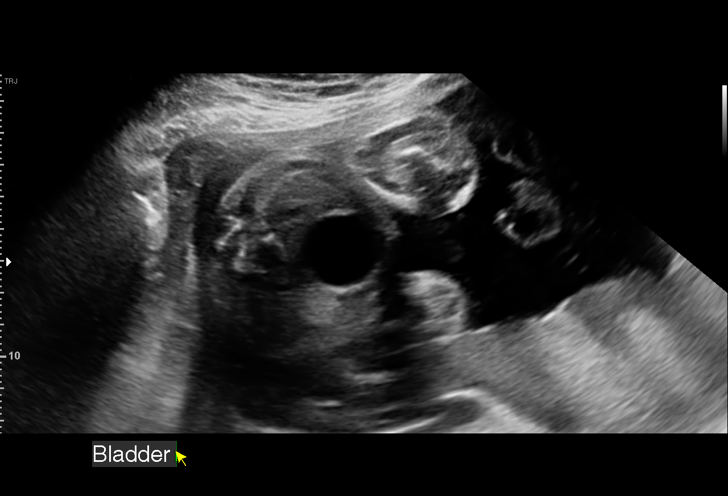
[im 18/28]
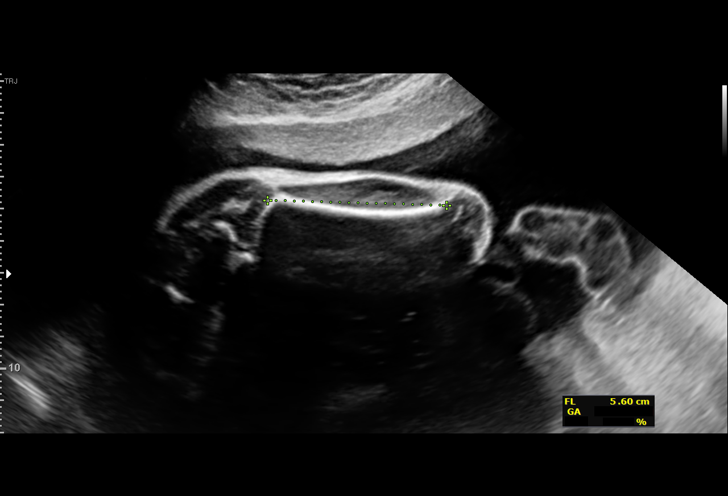
[im 20/28]
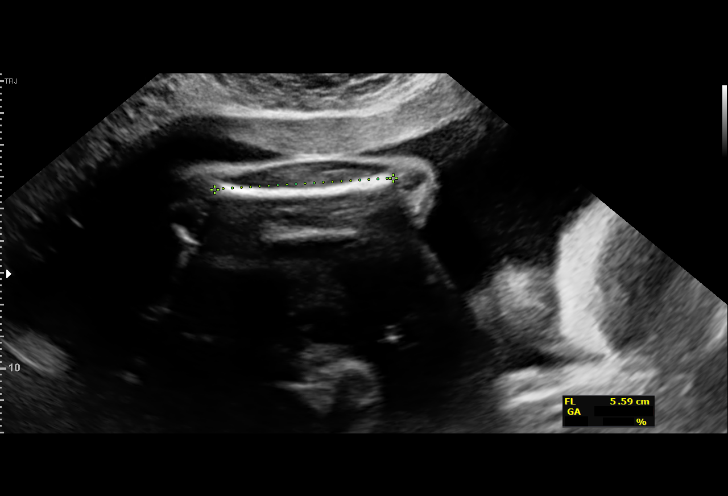
[im 22/28]
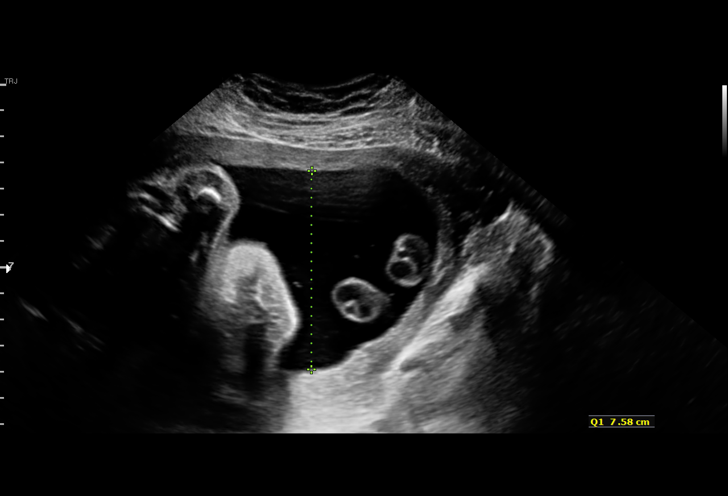
[im 24/28]
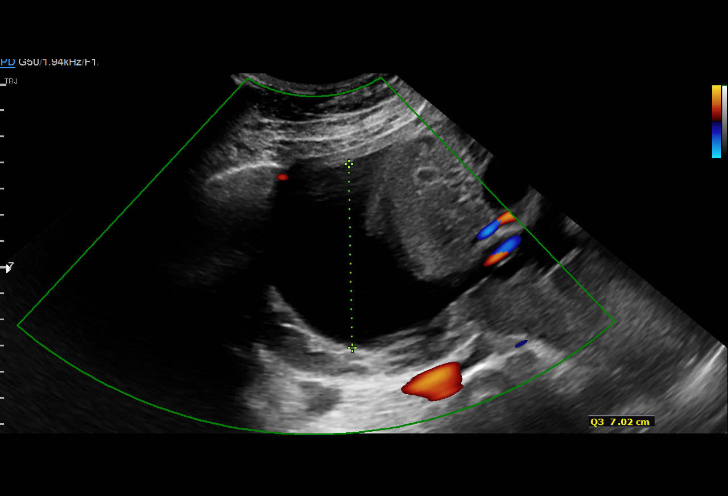
[im 26/28]
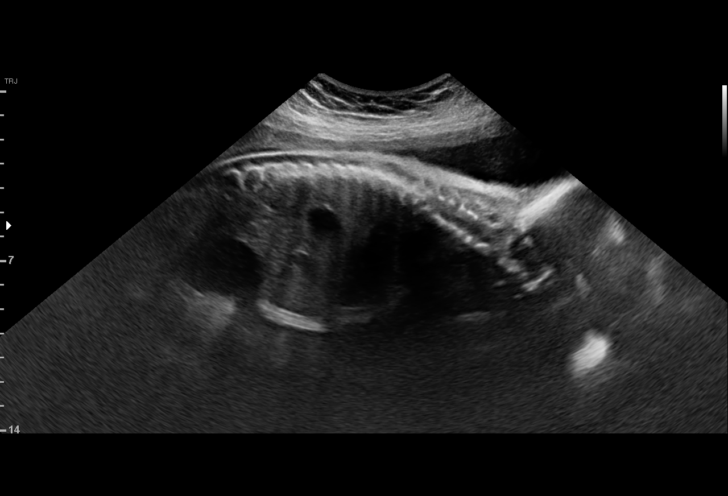
[im 28/28]
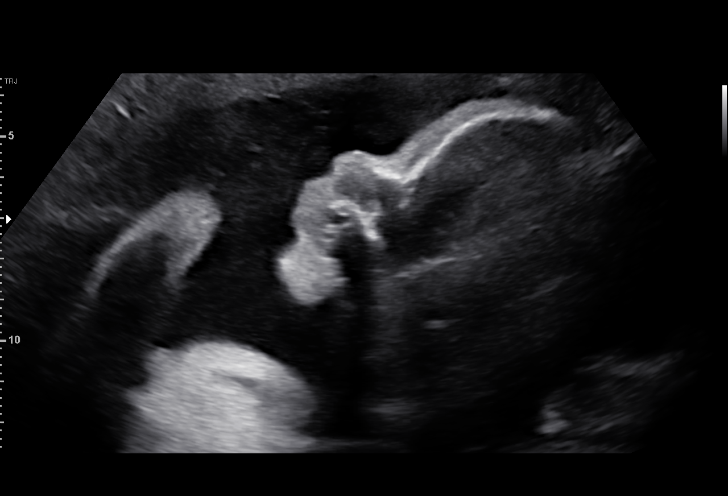

[14 of 28 positions shown; findings below may reference images not displayed]

----------------------------------------------------------------------

 ----------------------------------------------------------------------
Indications

  Low risk NIPS
  Medical complication of pregnancy
  (polyarthraligia/fibromyalgia,cfs)
  Pregnancy complicated by previous gastric
  bypass, antepartum, second trimester
  Encounter for fetal growth retardation
  29 weeks gestation of pregnancy
 ----------------------------------------------------------------------
Fetal Evaluation

 Num Of Fetuses:         1
 Fetal Heart Rate(bpm):  137
 Cardiac Activity:       Observed
 Presentation:           Cephalic
 Placenta:               Posterior Fundal

 Amniotic Fluid
 AFI FV:      Within normal limits

 AFI Sum(cm)     %Tile       Largest Pocket(cm)
 23.83           96

 RUQ(cm)       RLQ(cm)       LUQ(cm)        LLQ(cm)

Biometry

 BPD:      78.4  mm     G. Age:  31w 3d         91  %    CI:         77.8   %    70 - 86
                                                         FL/HC:      19.8   %    19.6 -
 HC:      281.3  mm     G. Age:  30w 6d         58  %    HC/AC:      1.16        0.99 -
 AC:      243.2  mm     G. Age:  28w 4d         20  %    FL/BPD:     71.2   %    71 - 87
 FL:       55.8  mm     G. Age:  29w 3d         33  %    FL/AC:      22.9   %    20 - 24
 Est. FW:    0682  gm           3 lb     31  %
OB History

 Gravidity:    2         Term:   1
 Living:       1
Gestational Age

 LMP:           29w 3d        Date:  11/11/18                 EDD:   08/18/19
 U/S Today:     30w 1d                                        EDD:   08/13/19
 Best:          29w 3d     Det. By:  LMP  (11/11/18)          EDD:   08/18/19
Anatomy

 Cranium:               Appears normal         Aortic Arch:            Previously seen
 Cavum:                 Appears normal         Ductal Arch:            Previously seen
 Ventricles:            Appears normal         Diaphragm:              Previously seen
 Choroid Plexus:        Appears normal         Stomach:                Appears normal, left
                                                                       sided
 Cerebellum:            Previously seen        Abdomen:                Previously seen
 Posterior Fossa:       Previously seen        Abdominal Wall:         Previously seen
 Nuchal Fold:           Previously seen        Cord Vessels:           Previously seen
 Face:                  Profile previously     Kidneys:                Appear normal
                        seen
 Lips:                  Previously seen        Bladder:                Appears normal
 Heart:                 Previously seen        Spine:                  Previously seen
 RVOT:                  Previously seen        Upper Extremities:      Previously seen
 LVOT:                  Previously seen        Lower Extremities:      Previously seen

 Other:  Fetus appears to be a male.
Impression

 Normal interval growth.
 History of gastric bypass
Recommendations

 Repeat growth in 4-6 weeks.

## 2021-04-30 IMAGING — US US MFM OB FOLLOW UP
1 series · 14 of 28 positions shown · non-contrast
Comparison: none

[Series 1: us mfm ob follow up · 43 acquisitions, 14 frames shown]
[im 2/43]
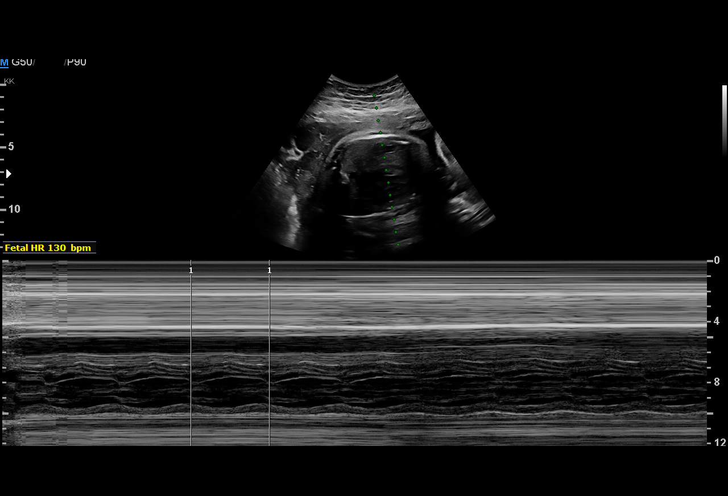
[im 5/43]
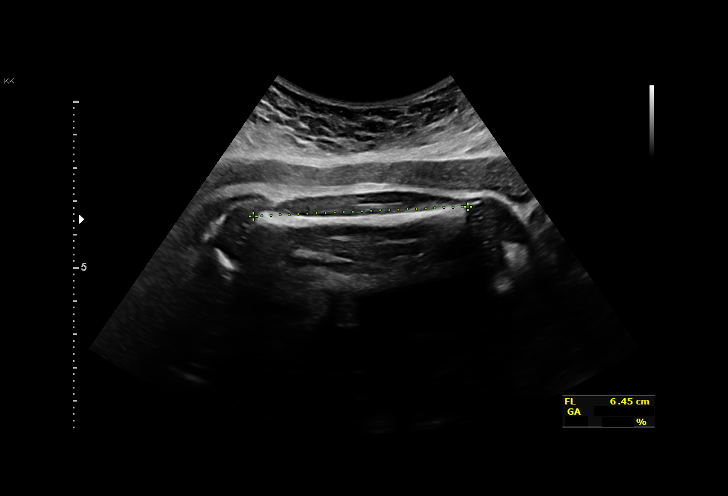
[im 8/43]
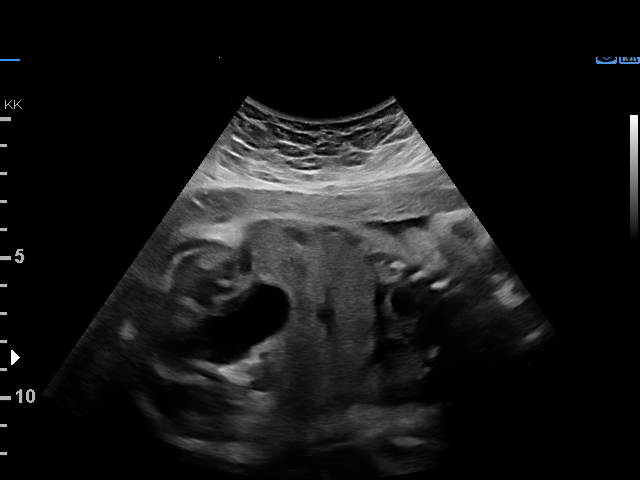
[im 11/43]
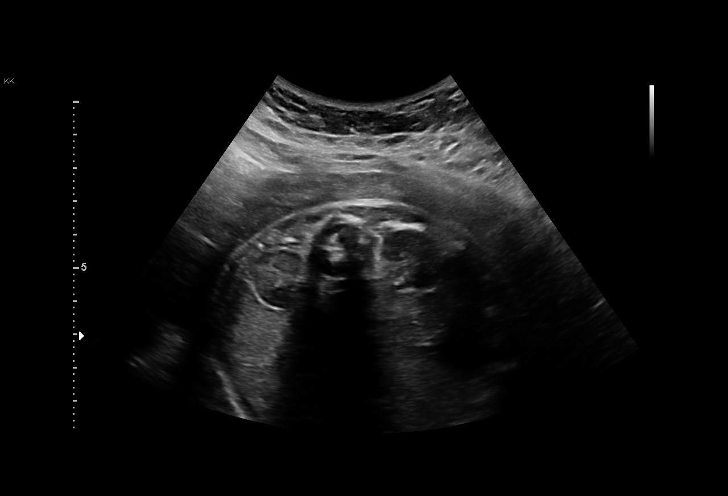
[im 15/43]
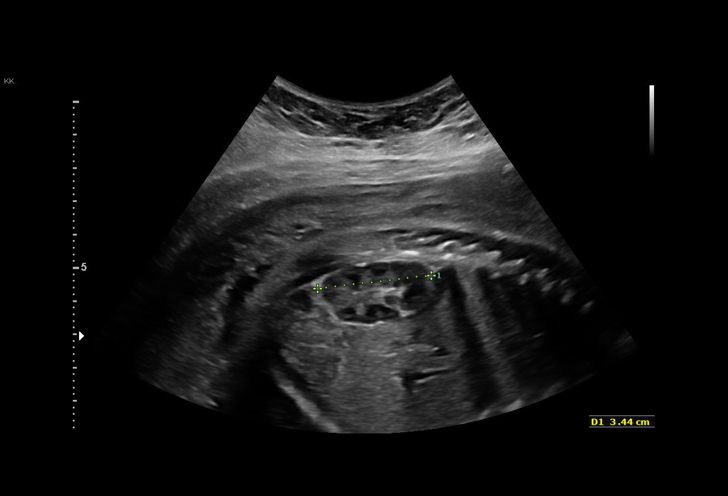
[im 18/43]
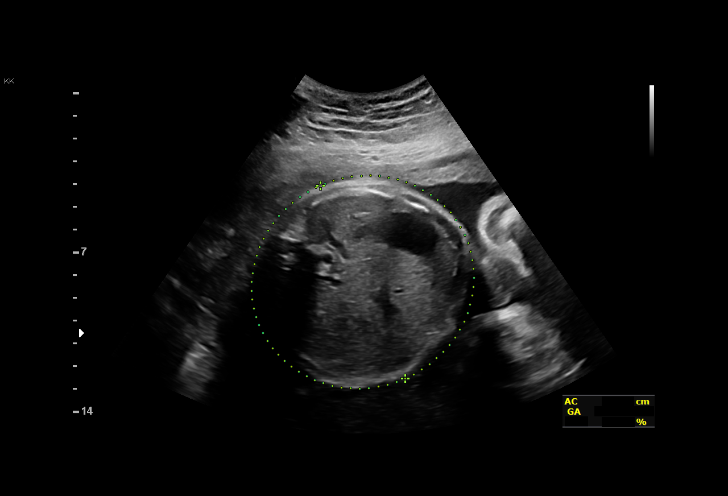
[im 21/43]
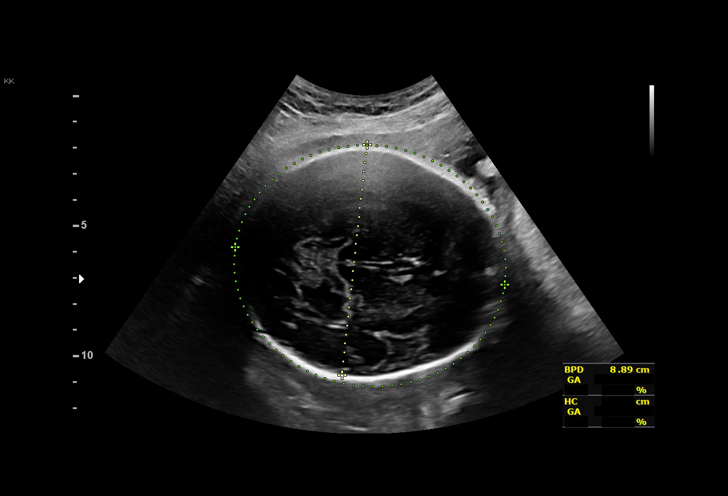
[im 24/43]
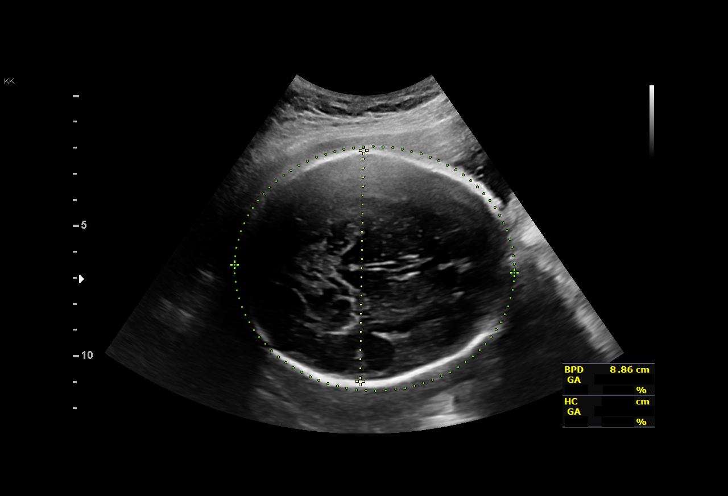
[im 27/43]
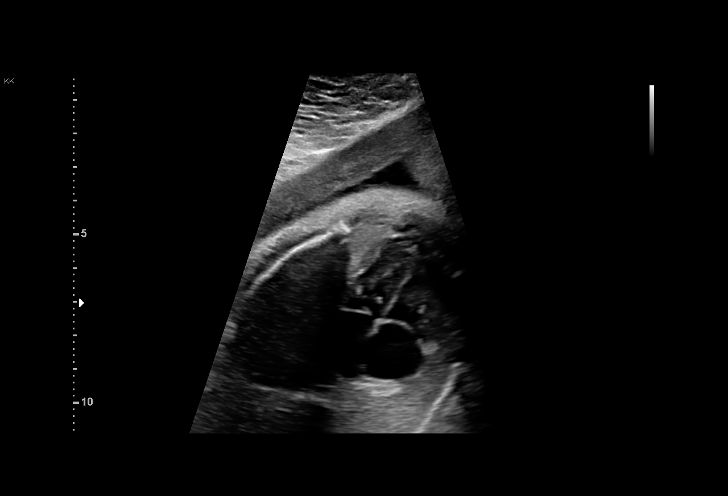
[im 30/43]
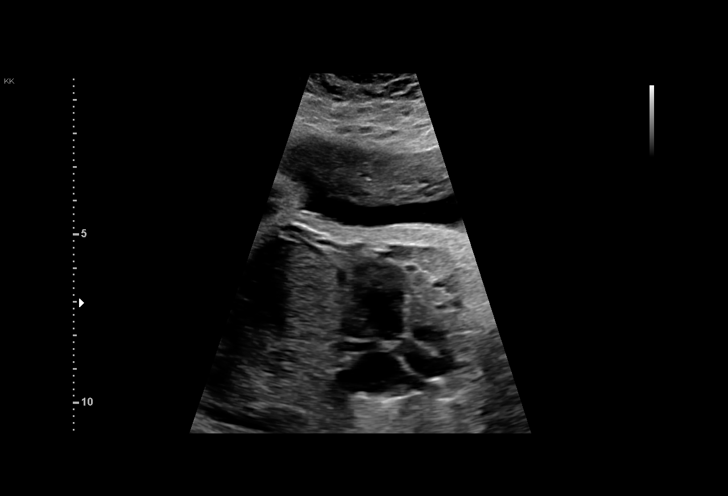
[im 33/43]
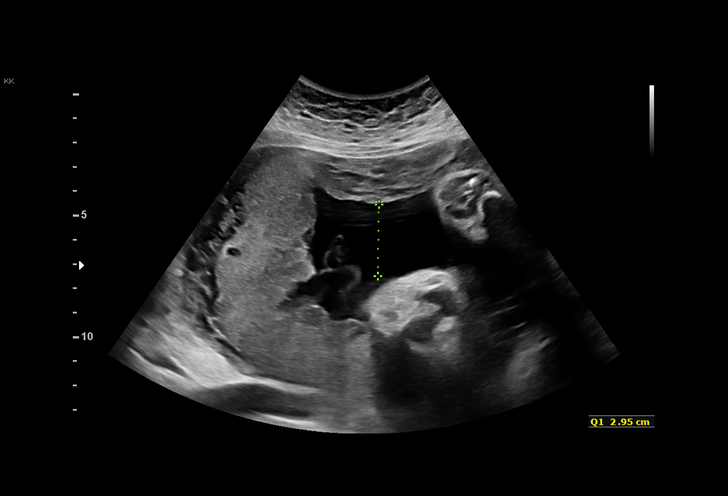
[im 36/43]
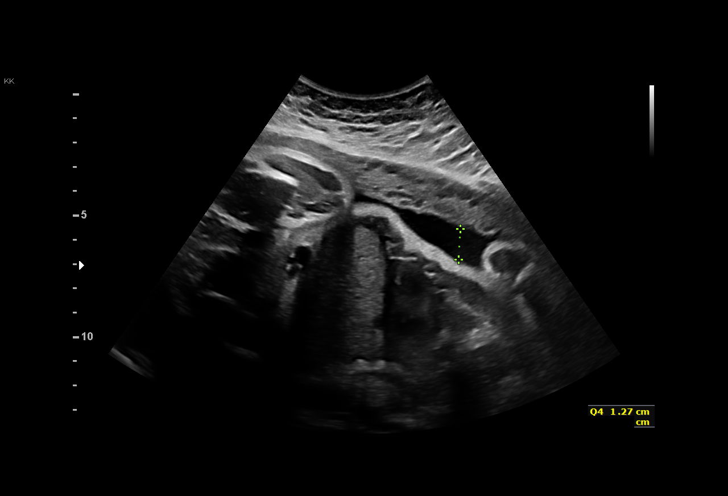
[im 39/43]
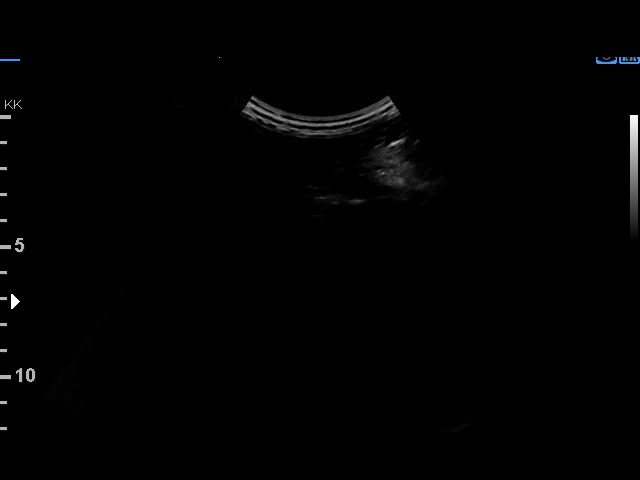
[im 43/43]
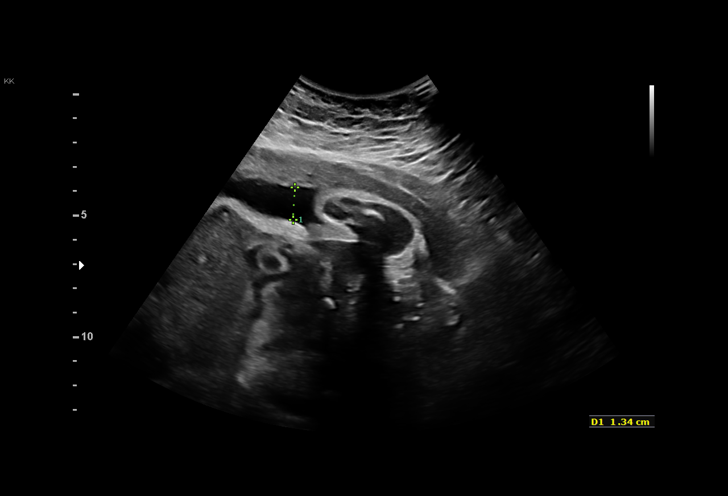

[14 of 28 positions shown; findings below may reference images not displayed]

ARES
 ----------------------------------------------------------------------

 ----------------------------------------------------------------------
Indications

  33 weeks gestation of pregnancy
  Low risk NIPS
  Medical complication of pregnancy
  (polyarthraligia/fibromyalgia,cfs)
  Pregnancy complicated by previous gastric
  bypass, antepartum, third trimester
  Encounter for other antenatal screening
  follow-up
 ----------------------------------------------------------------------
Fetal Evaluation

 Num Of Fetuses:         1
 Fetal Heart Rate(bpm):  130
 Cardiac Activity:       Observed
 Presentation:           Cephalic
 Placenta:               Posterior Fundal
 P. Cord Insertion:      Previously Visualized

 Amniotic Fluid
 AFI FV:      Within normal limits

 AFI Sum(cm)     %Tile       Largest Pocket(cm)
 9.18            13

 RUQ(cm)       RLQ(cm)       LUQ(cm)        LLQ(cm)

Biometry

 BPD:      88.7  mm     G. Age:  35w 6d         93  %    CI:        79.97   %    70 - 86
                                                         FL/HC:      20.8   %    19.4 -
 HC:      313.4  mm     G. Age:  35w 1d         46  %    HC/AC:      1.06        0.96 -
 AC:      296.2  mm     G. Age:  33w 4d         46  %    FL/BPD:     73.4   %    71 - 87
 FL:       65.1  mm     G. Age:  33w 4d         32  %    FL/AC:      22.0   %    20 - 24

 Est. FW:    8026  gm      5 lb 2 oz     46  %
OB History

 Gravidity:    2         Term:   1
 Living:       1
Gestational Age

 LMP:           33w 6d        Date:  11/11/18                 EDD:   08/18/19
 U/S Today:     34w 4d                                        EDD:   08/13/19
 Best:          33w 6d     Det. By:  LMP  (11/11/18)          EDD:   08/18/19
Anatomy

 Cranium:               Appears normal         Aortic Arch:            Previously seen
 Cavum:                 Appears normal         Ductal Arch:            Previously seen
 Ventricles:            Appears normal         Diaphragm:              Appears normal
 Choroid Plexus:        Appears normal         Stomach:                Appears normal, left
                                                                       sided
 Cerebellum:            Previously seen        Abdomen:                Previously seen
 Posterior Fossa:       Previously seen        Abdominal Wall:         Previously seen
 Nuchal Fold:           Previously seen        Cord Vessels:           Previously seen
 Face:                  Orbits and profile     Kidneys:                Appear normal
                        previously seen
 Lips:                  Previously seen        Bladder:                Appears normal
 Heart:                 Appears normal         Spine:                  Previously seen
                        (4CH, axis, and
                        situs)
 RVOT:                  Previously seen        Upper Extremities:      Previously seen
 LVOT:                  Previously seen        Lower Extremities:      Previously seen

 Other:  Fetus appears to be a male.
Cervix Uterus Adnexa

 Cervix
 Not visualized (advanced GA >49wks)
Impression

 Amniotic fluid is normal and good fetal activity is seen. Fetal
 growth is appropriate for gestational age.
Recommendations

 Follow-up scans as clinically indicated.
                 Thakur, Ulices

## 2022-05-13 IMAGING — DX DG SI JOINTS 3+V
3 series · 3 of 3 positions shown · non-contrast
Comparison: None.

CLINICAL DATA: Recent fall with sacroiliac joint pain, initial
encounter

EXAM:
BILATERAL SACROILIAC JOINTS - 3+ VIEW

[si joints ap]
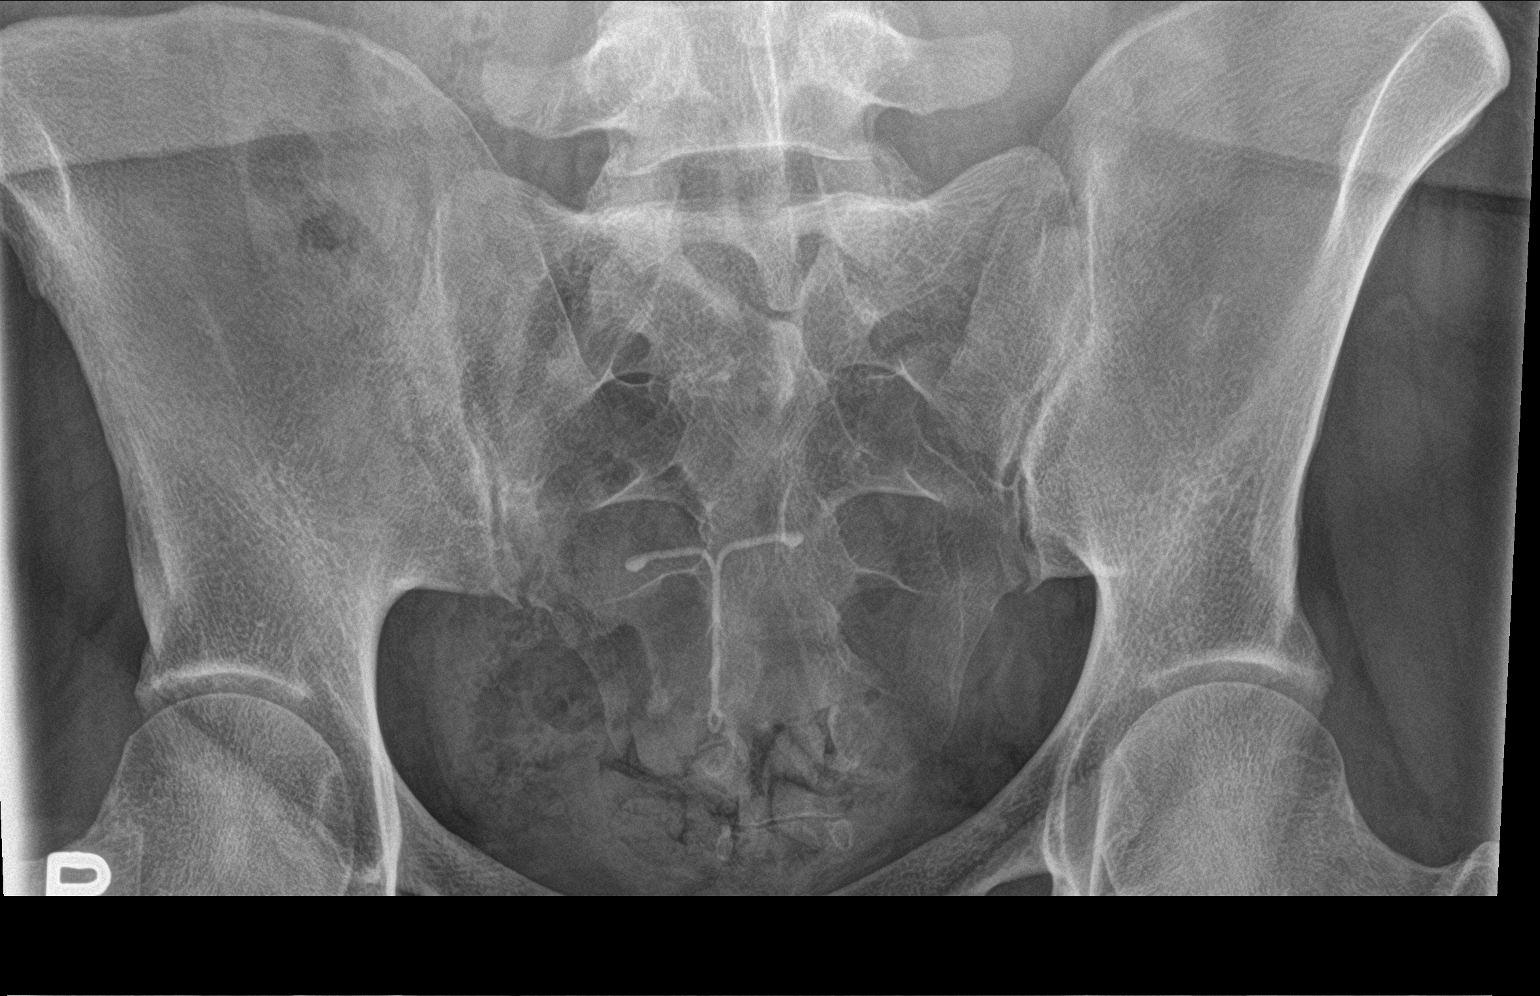

[si joints obl (1 of 2)]
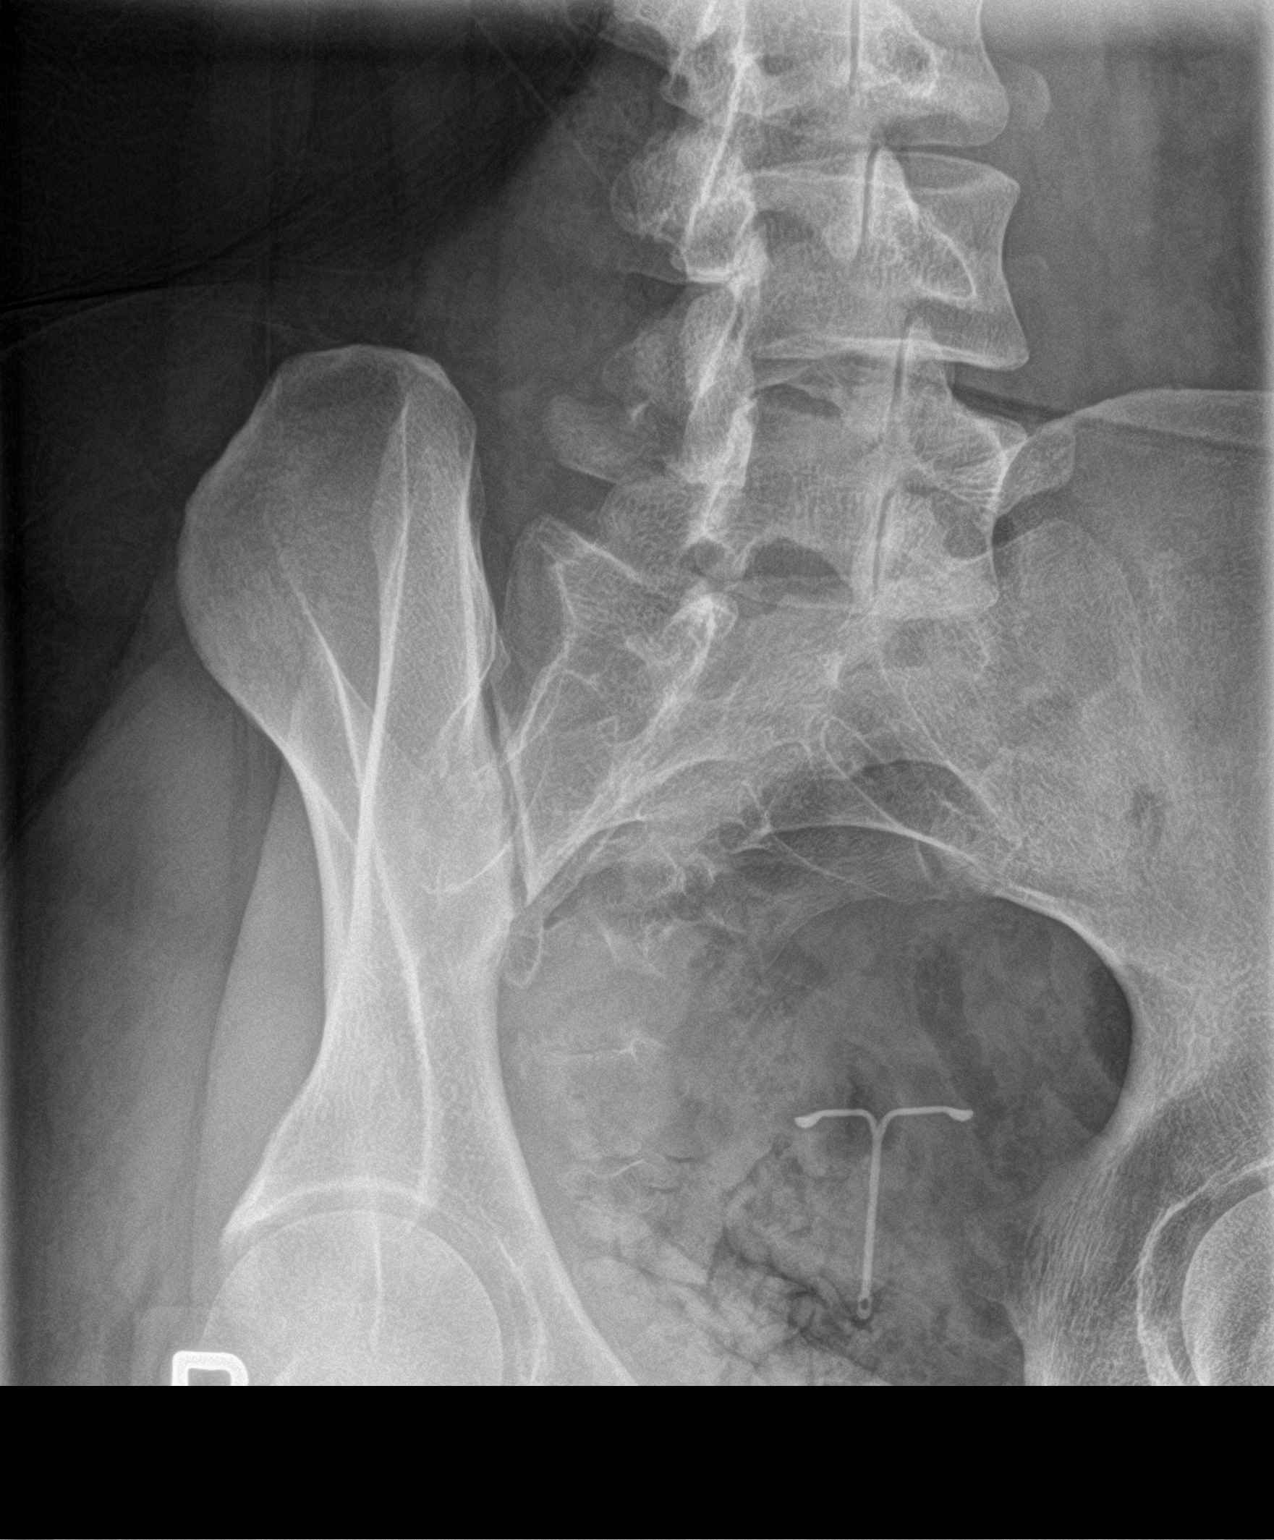

[si joints obl (2 of 2)]
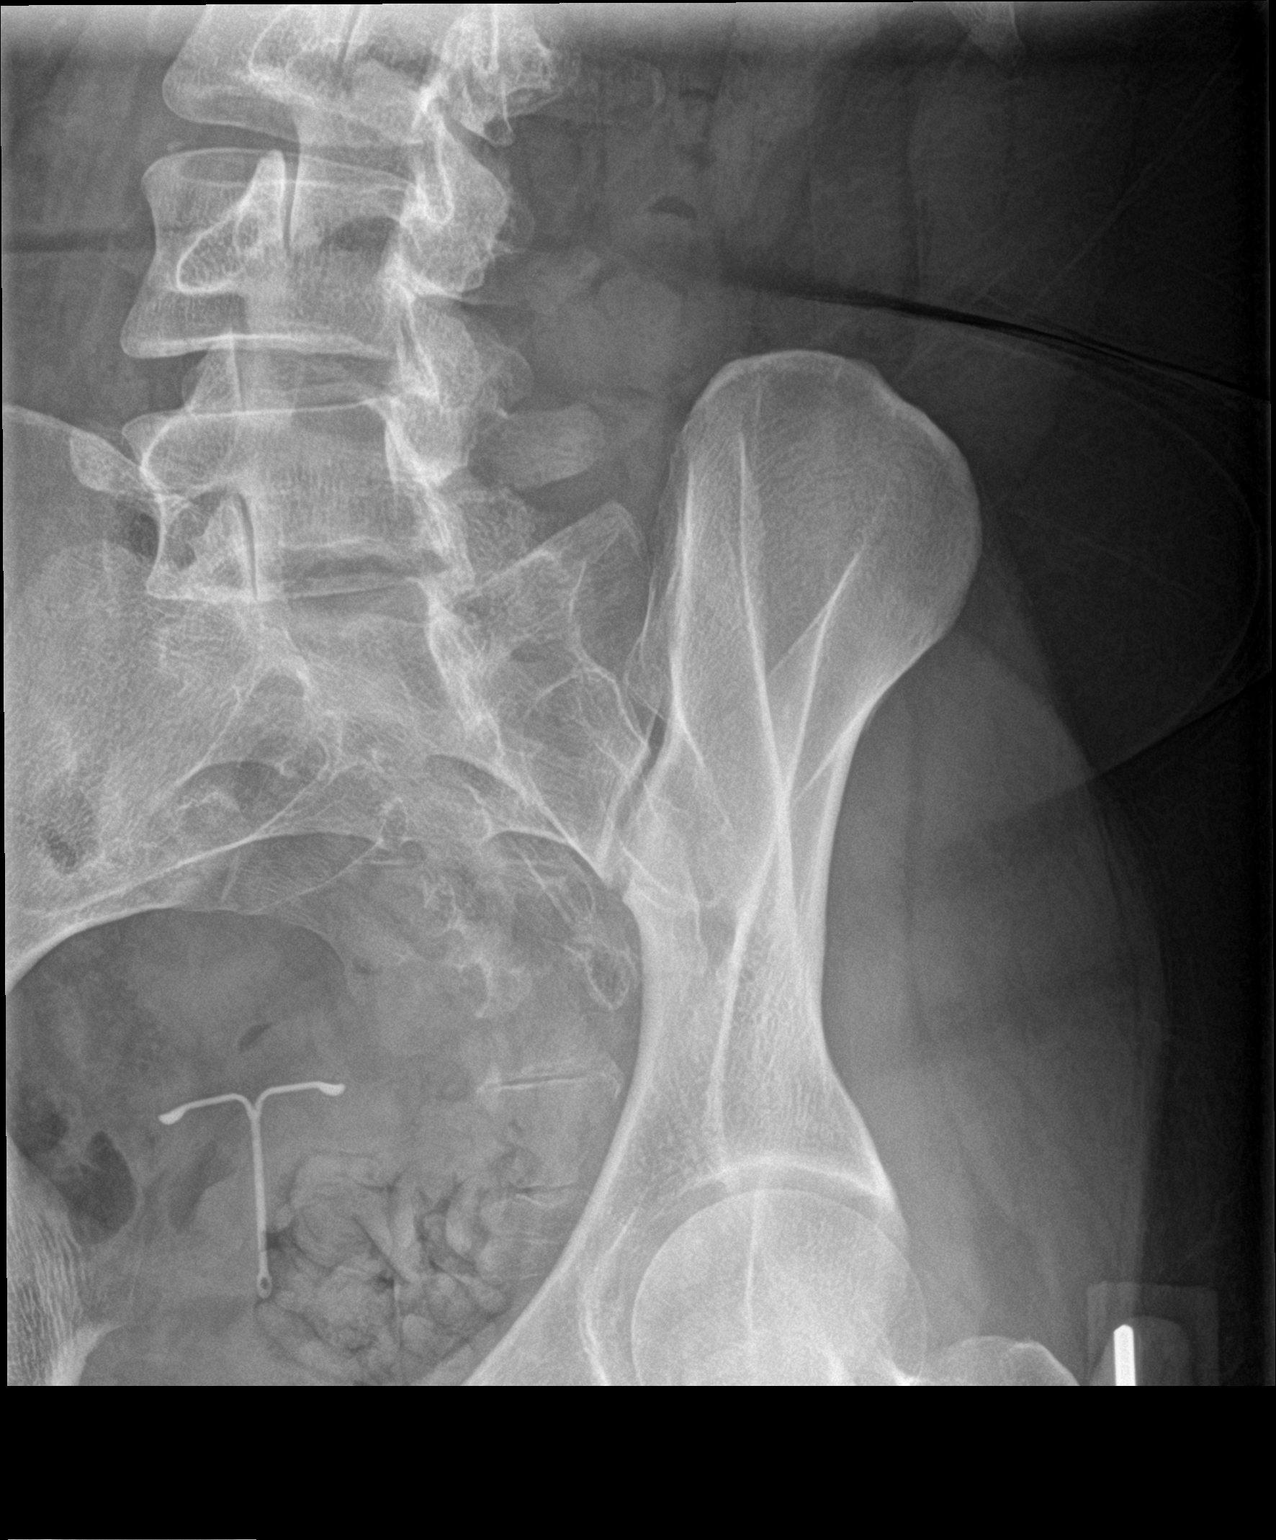

[3 of 3 positions shown; findings below may reference images not displayed]

FINDINGS: Sacrum is well visualized. Sacral ala are unremarkable. Sacroiliac
joints are patent. IUD is noted in place. No other focal abnormality
is noted.
IMPRESSION: Unremarkable sacrum and sacroiliac joints.
